# Patient Record
Sex: Male | Born: 1955 | Race: White | Hispanic: No | Smoking: Former smoker
Health system: Southern US, Community
[De-identification: ages and names within clinical notes are randomized; demographics above are authoritative.]

## PROBLEM LIST (undated history)

## (undated) DIAGNOSIS — E785 Hyperlipidemia, unspecified: Secondary | ICD-10-CM

## (undated) DIAGNOSIS — I10 Essential (primary) hypertension: Secondary | ICD-10-CM

## (undated) HISTORY — DX: Hyperlipidemia, unspecified: E78.5

---

## 2006-08-07 ENCOUNTER — Emergency Department: Payer: Self-pay

## 2006-10-02 ENCOUNTER — Emergency Department: Payer: Self-pay | Admitting: Emergency Medicine

## 2008-07-01 LAB — HM COLONOSCOPY: HM Colonoscopy: NORMAL

## 2008-09-05 ENCOUNTER — Emergency Department: Payer: Self-pay | Admitting: Emergency Medicine

## 2008-09-11 ENCOUNTER — Emergency Department: Payer: Self-pay

## 2009-02-09 ENCOUNTER — Ambulatory Visit: Payer: Self-pay | Admitting: Gastroenterology

## 2014-06-14 LAB — BASIC METABOLIC PANEL
BUN: 12 mg/dL (ref 4–21)
Creatinine: 0.9 mg/dL (ref <=1.3)
Glucose: 83 mg/dL

## 2014-06-14 LAB — FECAL OCCULT BLOOD, GUAIAC: Fecal Occult Blood: NEGATIVE

## 2014-06-14 LAB — LIPID PANEL
Cholesterol: 226 mg/dL — AB (ref 0–200)
HDL: 53 mg/dL (ref 35–70)
LDL Cholesterol: 156 mg/dL
Triglycerides: 85 mg/dL (ref 40–160)

## 2014-11-08 DIAGNOSIS — E7800 Pure hypercholesterolemia, unspecified: Secondary | ICD-10-CM | POA: Insufficient documentation

## 2014-11-08 DIAGNOSIS — Z1331 Encounter for screening for depression: Secondary | ICD-10-CM | POA: Insufficient documentation

## 2014-11-08 DIAGNOSIS — I10 Essential (primary) hypertension: Secondary | ICD-10-CM | POA: Insufficient documentation

## 2014-11-08 DIAGNOSIS — Z0000 Encounter for general adult medical examination without abnormal findings: Secondary | ICD-10-CM | POA: Insufficient documentation

## 2014-11-08 DIAGNOSIS — Z8739 Personal history of other diseases of the musculoskeletal system and connective tissue: Secondary | ICD-10-CM | POA: Insufficient documentation

## 2015-01-06 ENCOUNTER — Encounter: Payer: Self-pay | Admitting: Emergency Medicine

## 2015-01-06 ENCOUNTER — Emergency Department
Admission: EM | Admit: 2015-01-06 | Discharge: 2015-01-06 | Disposition: A | Discharge status: Home / Self Care | Payer: BLUE CROSS/BLUE SHIELD | Attending: Emergency Medicine | Admitting: Emergency Medicine

## 2015-01-06 ENCOUNTER — Emergency Department: Payer: BLUE CROSS/BLUE SHIELD

## 2015-01-06 DIAGNOSIS — I10 Essential (primary) hypertension: Secondary | ICD-10-CM | POA: Insufficient documentation

## 2015-01-06 DIAGNOSIS — Z7982 Long term (current) use of aspirin: Secondary | ICD-10-CM | POA: Insufficient documentation

## 2015-01-06 DIAGNOSIS — R109 Unspecified abdominal pain: Secondary | ICD-10-CM

## 2015-01-06 DIAGNOSIS — Z87891 Personal history of nicotine dependence: Secondary | ICD-10-CM | POA: Insufficient documentation

## 2015-01-06 HISTORY — DX: Essential (primary) hypertension: I10

## 2015-01-06 LAB — CBC WITH DIFFERENTIAL/PLATELET
Basophils Absolute: 0.1 10*3/uL (ref 0–0.1)
Basophils Relative: 1 %
Eosinophils Absolute: 0 10*3/uL (ref 0–0.7)
Eosinophils Relative: 1 %
HCT: 43.7 % (ref 40.0–52.0)
Hemoglobin: 14.9 g/dL (ref 13.0–18.0)
Lymphocytes Relative: 21 %
Lymphs Abs: 1.4 10*3/uL (ref 1.0–3.6)
MCH: 31 pg (ref 26.0–34.0)
MCHC: 34.2 g/dL (ref 32.0–36.0)
MCV: 90.8 fL (ref 80.0–100.0)
Monocytes Absolute: 0.7 10*3/uL (ref 0.2–1.0)
Monocytes Relative: 10 %
Neutro Abs: 4.4 10*3/uL (ref 1.4–6.5)
Neutrophils Relative %: 67 %
Platelets: 255 10*3/uL (ref 150–440)
RBC: 4.81 MIL/uL (ref 4.40–5.90)
RDW: 12.8 % (ref 11.5–14.5)
WBC: 6.6 10*3/uL (ref 3.8–10.6)

## 2015-01-06 LAB — COMPREHENSIVE METABOLIC PANEL
ALT: 44 U/L (ref 17–63)
AST: 46 U/L — ABNORMAL HIGH (ref 15–41)
Albumin: 4.5 g/dL (ref 3.5–5.0)
Alkaline Phosphatase: 62 U/L (ref 38–126)
Anion gap: 12 (ref 5–15)
BUN: 18 mg/dL (ref 6–20)
CO2: 24 mmol/L (ref 22–32)
Calcium: 8.9 mg/dL (ref 8.9–10.3)
Chloride: 97 mmol/L — ABNORMAL LOW (ref 101–111)
Creatinine, Ser: 1.06 mg/dL (ref 0.61–1.24)
GFR calc Af Amer: >60 mL/min (ref >60)
GFR calc non Af Amer: >60 mL/min (ref >60)
Glucose, Bld: 125 mg/dL — ABNORMAL HIGH (ref 65–99)
Potassium: 3.1 mmol/L — ABNORMAL LOW (ref 3.5–5.1)
Sodium: 133 mmol/L — ABNORMAL LOW (ref 135–145)
Total Bilirubin: 0.9 mg/dL (ref 0.3–1.2)
Total Protein: 8 g/dL (ref 6.5–8.1)

## 2015-01-06 NOTE — Discharge Instructions (Signed)
The x rays are normal as are the lab tests.   If you develop a f Abdominal Pain Many things can cause abdominal pain. Usually, abdominal pain is not caused by a disease and will improve without treatment. It can often be observed and treated at home. Your health care provider will do a physical exam and possibly order blood tests and X-rays to help determine the seriousness of your pain. However, in many cases, more time must pass before a clear cause of the pain can be found. Before that point, your health care provider may not know if you need more testing or further treatment. HOME CARE INSTRUCTIONS  Monitor your abdominal pain for any changes. The following actions may help to alleviate any discomfort you are experiencing:  Only take over-the-counter or prescription medicines as directed by your health care provider.  Do not take laxatives unless directed to do so by your health care provider.  Try a clear liquid diet (broth, tea, or water) as directed by your health care provider. Slowly move to a bland diet as tolerated. SEEK MEDICAL CARE IF:  You have unexplained abdominal pain.  You have abdominal pain associated with nausea or diarrhea.  You have pain when you urinate or have a bowel movement.  You experience abdominal pain that wakes you in the night.  You have abdominal pain that is worsened or improved by eating food.  You have abdominal pain that is worsened with eating fatty foods.  You have a fever. SEEK IMMEDIATE MEDICAL CARE IF:   Your pain does not go away within 2 hours.  You keep throwing up (vomiting).  Your pain is felt only in portions of the abdomen, such as the right side or the left lower portion of the abdomen.  You pass bloody or black tarry stools. MAKE SURE YOU:  Understand these instructions.   Will watch your condition.   Will get help right away if you are not doing well or get worse.  Document Released: 03/27/2005 Document Revised:  06/22/2013 Document Reviewed: 02/24/2013 [employer] Patient Information 2015 [place], [place]. This information is not intended to replace advice given to you by your health care provider. Make sure you discuss any questions you have with your health care provider. ever, vomiting or severe belly pain please return.   Please follow up with your doctor in a bout a week otherwise.

## 2015-01-06 NOTE — ED Notes (Signed)
AAOx3.  Skin warm and dry.  D/c home 

## 2015-01-06 NOTE — ED Notes (Signed)
Pt states he had abd pain beginning Monday with diarrhea, took immodium Tue night, and has been constipated until this am, is concerned "he has some blockages." Denies pain, appears in no distress.

## 2015-01-06 NOTE — ED Provider Notes (Signed)
[facility] Emergency Department Provider Note  ____________________________________________  Time seen: Approximately 4:49 PM  I have reviewed the triage vital signs and the nursing notes.   HISTORY  Chief Complaint Abdominal Pain    HPI Kenneth Velasquez is a 59 y.o. male who reports having diarrhea several days ago he took Imodium and then he didn't have any stool on Wednesday or Thursday and then this morning he had at that the stool and felt better but he is worried he is obstructed he says she's had some blockages in the past although he's never had any surgery and he again is concerned he has a block in his bowel. He is not currently having any cramping E has again not had any surgery nothing seems to make this better or worse although he did have a little bit of reflux and 8 breakfast this morning and nothing else seems to be bothering him   Past Medical History  Diagnosis Date  . Hypertension     Patient Active Problem List   Diagnosis Date Noted  . Routine general medical examination at a health care facility 11/08/2014  . Benign essential HTN 11/08/2014  . Screening for depression 11/08/2014  . Hypercholesteremia 11/08/2014  . H/O arthritis 11/08/2014    History reviewed. No pertinent past surgical history.  Current Outpatient Rx  Name  Route  Sig  Dispense  Refill  . aspirin 81 MG tablet   Oral   Take 1 tablet by mouth daily.         . hydrochlorothiazide (MICROZIDE) 12.5 MG capsule   Oral   Take 1 capsule by mouth daily.           Allergies Review of patient's allergies indicates no known allergies.  No family history on file.  Social History History  Substance Use Topics  . Smoking status: Former Smoker    Types: Cigarettes  . Smokeless tobacco: Former Neurosurgeon    Quit date: 07/01/2008  . Alcohol Use: 0.0 oz/week    0 Standard drinks or equivalent per week     Comment: occas    Review of Systems Constitutional: No  fever/chills Eyes: No visual changes. ENT: No sore throat. Cardiovascular: Denies chest pain. Respiratory: Denies shortness of breath. Gastrointestinal: No abdominal pain.  No nausea, no vomiting.  No diarrhea.  No constipation. Genitourinary: Negative for dysuria. Musculoskeletal: Negative for back pain. Skin: Negative for rash. Neurological: Negative for headaches, focal weakness or numbness.  10-point ROS otherwise negative.  ____________________________________________   PHYSICAL EXAM:  VITAL SIGNS: ED Triage Vitals  Enc Vitals Group     BP 01/06/15 1353 167/82 mmHg     Pulse Rate 01/06/15 1353 91     Resp 01/06/15 1353 18     Temp 01/06/15 1353 97.9 F (36.6 C)     Temp Source 01/06/15 1353 Oral     SpO2 01/06/15 1353 100 %     Weight 01/06/15 1353 155 lb (70.308 kg)     Height 01/06/15 1353  (1.753 m)     Head Cir --      Peak Flow --      Pain Score 01/06/15 1631 0     Pain Loc --      Pain Edu? --      Excl. in GC? --     Constitutional: Alert and oriented. Well appearing and in no acute distress. Eyes: Conjunctivae are normal. PERRL. EOMI. Head: Atraumatic. Nose: No congestion/rhinnorhea. Mouth/Throat: Mucous membranes  are moist.  Oropharynx non-erythematous. Neck: No stridor.   Cardiovascular: Normal rate, regular rhythm. Grossly normal heart sounds.  Good peripheral circulation. Respiratory: Normal respiratory effort.  No retractions. Lungs CTAB. Gastrointestinal: Soft and nontender. No distention. No abdominal bruits. No CVA tenderness. Musculoskeletal: No lower extremity tenderness nor edema.  No joint effusions. Neurologic:  Normal speech and language. No gross focal neurologic deficits are appreciated. Speech is normal. No gait instability. Skin:  Skin is warm, dry and intact. No rash noted. Psychiatric: Mood and affect are normal. Speech and behavior are normal.  ____________________________________________   LABS (all labs ordered are  listed, but only abnormal results are displayed)  Labs Reviewed  COMPREHENSIVE METABOLIC PANEL - Abnormal; Notable for the following:    Sodium 133 (*)    Potassium 3.1 (*)    Chloride 97 (*)    Glucose, Bld 125 (*)    AST 46 (*)    All other components within normal limits  CBC WITH DIFFERENTIAL/PLATELET   ____________________________________________  EKG   ____________________________________________  RADIOLOGY  Acute abdominal series is read by radiology and is normal ____________________________________________   PROCEDURES    ____________________________________________   INITIAL IMPRESSION / ASSESSMENT AND PLAN / ED COURSE  Pertinent labs & imaging results that were available during my care of the patient were reviewed by me and considered in my medical decision making (see chart for details).  These are normal patient's abdominal exam is benign discharge ____________________________________________   FINAL CLINICAL IMPRESSION(S) / ED DIAGNOSES  Final diagnoses:  Abdominal pain, unspecified abdominal location      Arnaldo NatalPaul F Dorlis Judice, MD 01/06/15 1731

## 2015-03-03 ENCOUNTER — Ambulatory Visit (INDEPENDENT_AMBULATORY_CARE_PROVIDER_SITE_OTHER): Payer: BLUE CROSS/BLUE SHIELD | Admitting: Family Medicine

## 2015-03-03 ENCOUNTER — Encounter: Payer: Self-pay | Admitting: Family Medicine

## 2015-03-03 VITALS — BP 110/62 | HR 60 | Ht 69.0 in | Wt 160.2 lb

## 2015-03-03 DIAGNOSIS — I10 Essential (primary) hypertension: Secondary | ICD-10-CM | POA: Diagnosis not present

## 2015-03-03 MED ORDER — HYDROCHLOROTHIAZIDE 12.5 MG PO CAPS: 12.5000 mg | ORAL_CAPSULE | Freq: Every day | ORAL | Status: DC

## 2015-03-03 NOTE — Progress Notes (Signed)
Name: Kenneth Velasquez   MRN: [id number]    DOB: 1955-10-19   Date:03/03/2015       Progress Note  Subjective  Chief Complaint  Chief Complaint  Patient presents with  . Hypertension    Hypertension This is a new problem. The current episode started more than 1 month ago. The problem has been gradually improving since onset. The problem is controlled. Pertinent negatives include no anxiety, blurred vision, chest pain, headaches, malaise/fatigue, neck pain, orthopnea, palpitations, peripheral edema, PND, shortness of breath or sweats. There are no associated agents to hypertension. There are no known risk factors for coronary artery disease. Past treatments include diuretics. The current treatment provides mild improvement. There are no compliance problems.  There is no history of angina, kidney disease, CAD/MI, CVA, heart failure, left ventricular hypertrophy, PVD, renovascular disease or retinopathy. There is no history of chronic renal disease.    No problem-specific assessment & plan notes found for this encounter.   Past Medical History  Diagnosis Date  . Hypertension     No past surgical history on file.  No family history on file.  Social History   Social History  . Marital Status: Married    Spouse Name: N/A  . Number of Children: N/A  . Years of Education: N/A   Occupational History  . Not on file.   Social History Main Topics  . Smoking status: Former Smoker    Types: Cigarettes  . Smokeless tobacco: Former Neurosurgeon    Quit date: 07/01/2008  . Alcohol Use: 0.0 oz/week    0 Standard drinks or equivalent per week     Comment: occas  . Drug Use: No  . Sexual Activity: Not on file   Other Topics Concern  . Not on file   Social History Narrative    No Known Allergies   Review of Systems  Constitutional: Negative for fever, chills, weight loss and malaise/fatigue.  HENT: Negative for ear discharge, ear pain and sore throat.   Eyes: Negative for blurred  vision.  Respiratory: Negative for cough, sputum production, shortness of breath and wheezing.   Cardiovascular: Negative for chest pain, palpitations, orthopnea, leg swelling and PND.  Gastrointestinal: Negative for heartburn, nausea, abdominal pain, diarrhea, constipation, blood in stool and melena.  Genitourinary: Negative for dysuria, urgency, frequency and hematuria.  Musculoskeletal: Negative for myalgias, back pain, joint pain and neck pain.  Skin: Negative for rash.  Neurological: Negative for dizziness, tingling, sensory change, focal weakness and headaches.  Endo/Heme/Allergies: Negative for environmental allergies and polydipsia. Does not bruise/bleed easily.  Psychiatric/Behavioral: Negative for depression and suicidal ideas. The patient is not nervous/anxious and does not have insomnia.      Objective  Filed Vitals:   03/03/15 0802  BP: 110/62  Pulse: 60  Height: 5\' 9"  (1.753 m)  Weight: 160 lb 3.2 oz (72.666 kg)    Physical Exam  Constitutional: He is oriented to person, place, and time and well-developed, well-nourished, and in no distress. No distress.  HENT:  Head: Normocephalic.  Right Ear: External ear normal.  Left Ear: External ear normal.  Nose: Nose normal.  Mouth/Throat: Oropharynx is clear and moist.  Eyes: Conjunctivae and EOM are normal. Pupils are equal, round, and reactive to light. Right eye exhibits no discharge. Left eye exhibits no discharge. No scleral icterus.  Neck: Normal range of motion. Neck supple. No JVD present. No tracheal deviation present. No thyromegaly present.  Cardiovascular: Normal rate, regular rhythm, normal heart sounds and intact  distal pulses.  Exam reveals no gallop and no friction rub.   No murmur heard. Pulmonary/Chest: Breath sounds normal. No respiratory distress. He has no wheezes. He has no rales.  Abdominal: Soft. Bowel sounds are normal. He exhibits no mass. There is no hepatosplenomegaly. There is no tenderness.  There is no rebound, no guarding and no CVA tenderness.  Musculoskeletal: Normal range of motion. He exhibits no edema or tenderness.  Lymphadenopathy:    He has no cervical adenopathy.  Neurological: He is alert and oriented to person, place, and time. He has normal sensation, normal strength, normal reflexes and intact cranial nerves. No cranial nerve deficit.  Skin: Skin is warm. No rash noted. He is not diaphoretic.  Psychiatric: Mood and affect normal.      Assessment & Plan  Problem List Items Addressed This Visit      Cardiovascular and Mediastinum   Benign essential HTN - Primary   Relevant Medications   hydrochlorothiazide (MICROZIDE) 12.5 MG capsule        Dr. Hayden [facility]  03/03/2015

## 2015-07-30 IMAGING — CR DG ABDOMEN ACUTE W/ 1V CHEST
3 series · 3 of 3 positions shown · non-contrast
Comparison: One-view abdomen 09/05/2008. Two-view chest x-ray
08/07/2006

CLINICAL DATA: Abdominal pain beginning [REDACTED] would diarrhea.
Constipation today.

EXAM:
DG ABDOMEN ACUTE W/ 1V CHEST

[chest pa]
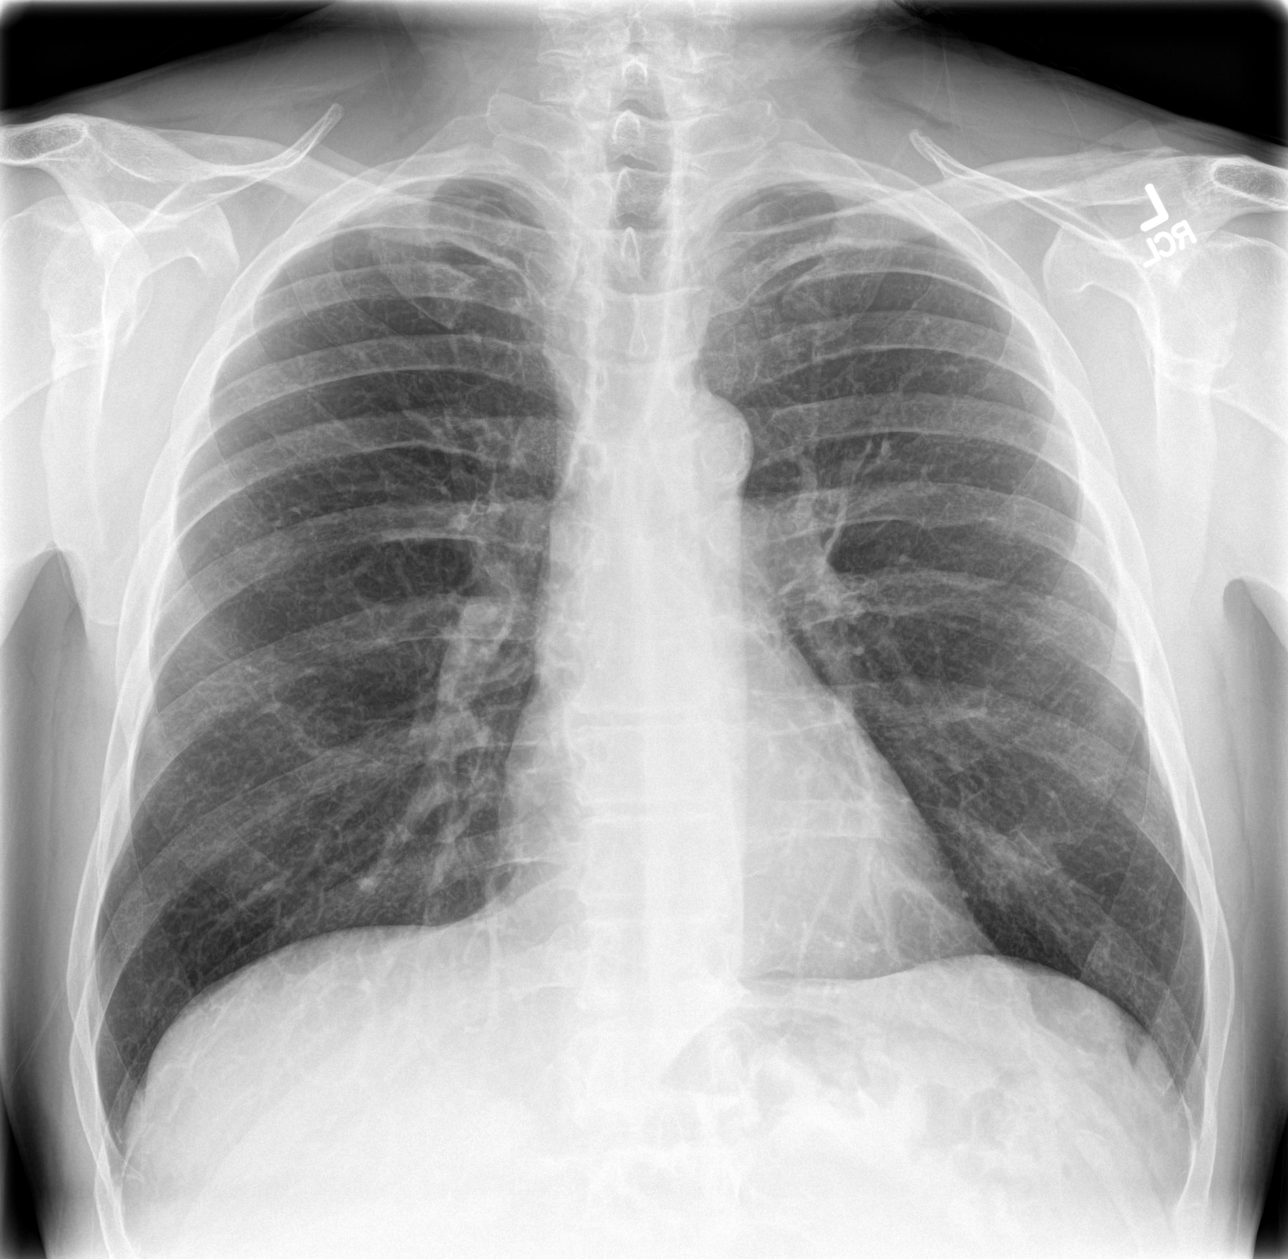

[abdomen erect]
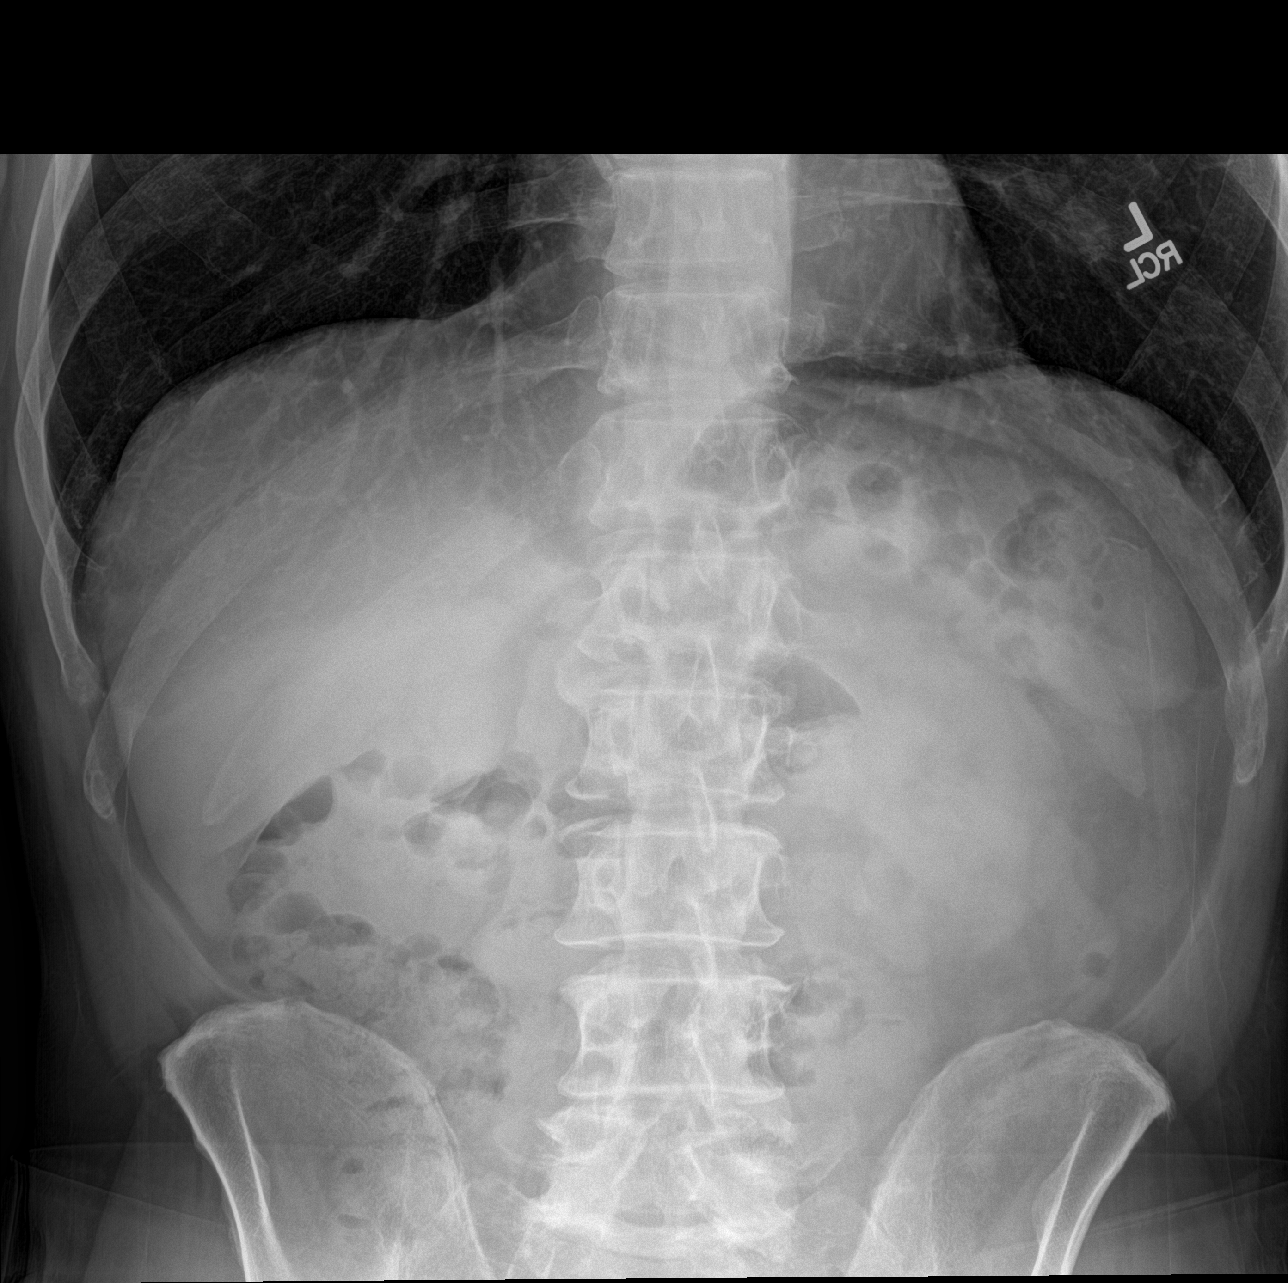

[abdomen supine]
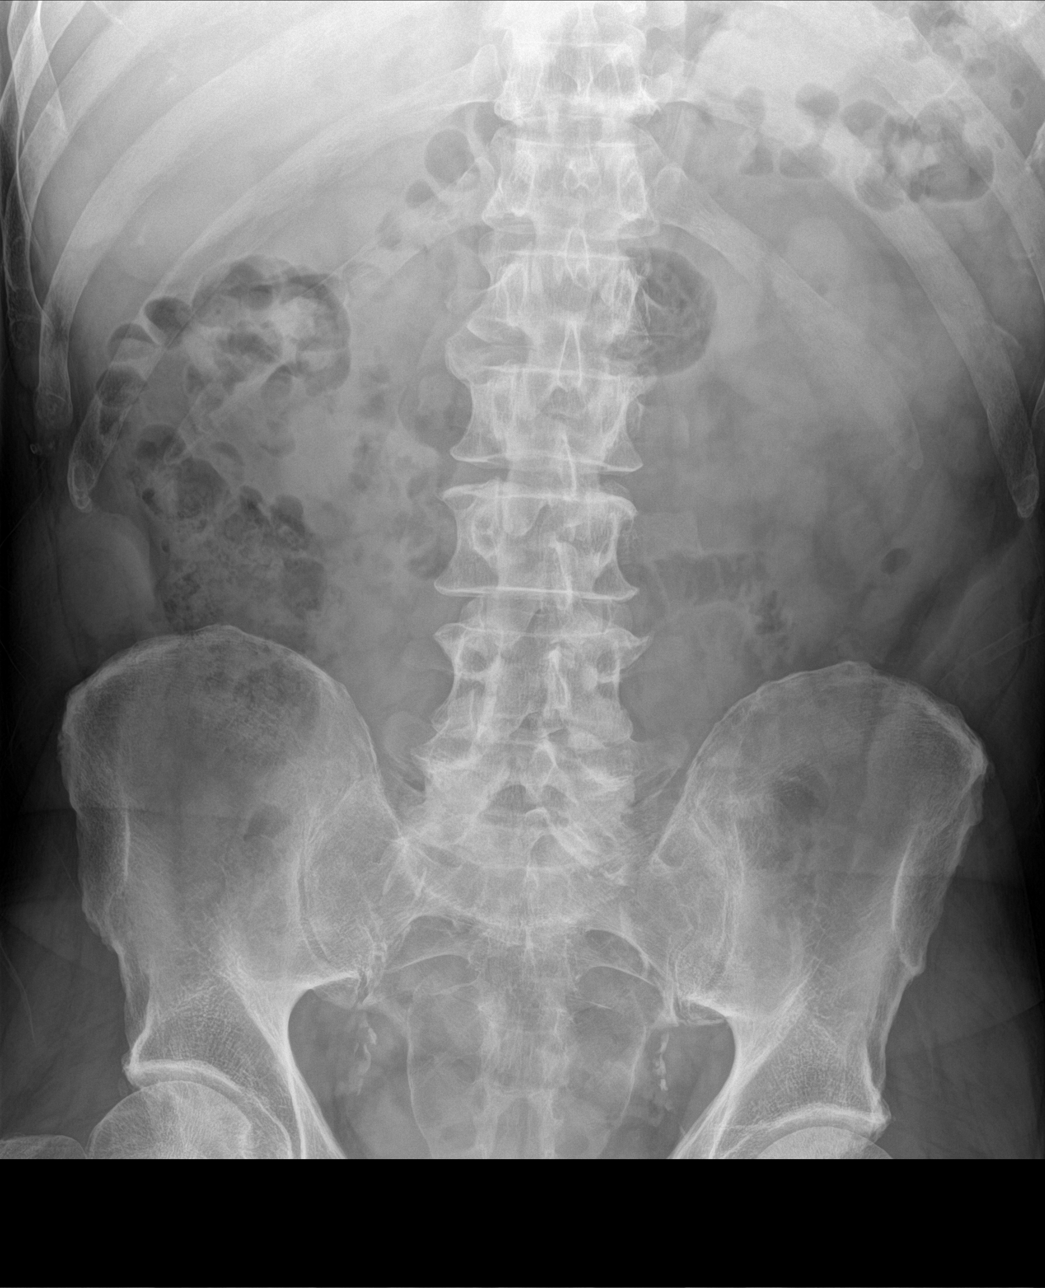

[3 of 3 positions shown; findings below may reference images not displayed]

FINDINGS: The heart size is normal. Atherosclerotic calcifications are noted
at the aortic arch. The lungs are clear.

Supine and upright views the abdomen demonstrate a normal bowel gas
pattern. There is no evidence for obstruction or free air.
Atherosclerotic calcifications are present in the aorta and iliac
vessels. The axial skeleton is within normal limits.
IMPRESSION: 1. No acute abnormality of the chest or abdomen.
2. Atherosclerosis.

## 2015-09-01 ENCOUNTER — Encounter: Payer: Self-pay | Admitting: Family Medicine

## 2015-09-01 ENCOUNTER — Ambulatory Visit (INDEPENDENT_AMBULATORY_CARE_PROVIDER_SITE_OTHER): Payer: BLUE CROSS/BLUE SHIELD | Admitting: Family Medicine

## 2015-09-01 VITALS — BP 118/62 | HR 68 | Ht 69.0 in | Wt 167.0 lb

## 2015-09-01 DIAGNOSIS — I10 Essential (primary) hypertension: Secondary | ICD-10-CM | POA: Diagnosis not present

## 2015-09-01 MED ORDER — HYDROCHLOROTHIAZIDE 12.5 MG PO CAPS: 12.5000 mg | ORAL_CAPSULE | Freq: Every day | ORAL | Status: DC

## 2015-09-01 NOTE — Progress Notes (Signed)
Name: Kenneth Velasquez   MRN: [id number]    DOB: 21-Sep-1955   Date:09/01/2015       Progress Note  Subjective  Chief Complaint  Chief Complaint  Patient presents with  . Hypertension    Hypertension This is a chronic problem. The current episode started more than 1 year ago. The problem has been gradually improving since onset. The problem is controlled. Pertinent negatives include no anxiety, blurred vision, chest pain, headaches, malaise/fatigue, neck pain, palpitations or shortness of breath. There are no associated agents to hypertension. Past treatments include diuretics. The current treatment provides moderate improvement. There are no compliance problems.  There is no history of angina, kidney disease, CAD/MI, CVA, heart failure, left ventricular hypertrophy, PVD, renovascular disease or retinopathy. There is no history of chronic renal disease or a hypertension causing med.    No problem-specific assessment & plan notes found for this encounter.   Past Medical History  Diagnosis Date  . Hypertension     History reviewed. No pertinent past surgical history.  History reviewed. No pertinent family history.  Social History   Social History  . Marital Status: Married    Spouse Name: N/A  . Number of Children: N/A  . Years of Education: N/A   Occupational History  . Not on file.   Social History Main Topics  . Smoking status: Former Smoker    Types: Cigarettes  . Smokeless tobacco: Former NeurosurgeonUser    Quit date: 07/01/2008  . Alcohol Use: 0.0 oz/week    0 Standard drinks or equivalent per week     Comment: occas  . Drug Use: No  . Sexual Activity: Not on file   Other Topics Concern  . Not on file   Social History Narrative    No Known Allergies   Review of Systems  Constitutional: Negative for fever, chills, weight loss and malaise/fatigue.  HENT: Negative for ear discharge, ear pain and sore throat.   Eyes: Negative for blurred vision.  Respiratory: Negative  for cough, sputum production, shortness of breath and wheezing.   Cardiovascular: Negative for chest pain, palpitations and leg swelling.  Gastrointestinal: Negative for heartburn, nausea, abdominal pain, diarrhea, constipation, blood in stool and melena.  Genitourinary: Negative for dysuria, urgency, frequency and hematuria.  Musculoskeletal: Negative for myalgias, back pain, joint pain and neck pain.  Skin: Negative for rash.  Neurological: Negative for dizziness, tingling, sensory change, focal weakness and headaches.  Endo/Heme/Allergies: Negative for environmental allergies and polydipsia. Does not bruise/bleed easily.  Psychiatric/Behavioral: Negative for depression and suicidal ideas. The patient is not nervous/anxious and does not have insomnia.      Objective  Filed Vitals:   09/01/15 0757  BP: 118/62  Pulse: 68  Height: 5\' 9"  (1.753 m)  Weight: 167 lb (75.751 kg)    Physical Exam  Constitutional: He is oriented to person, place, and time and well-developed, well-nourished, and in no distress.  HENT:  Head: Normocephalic.  Right Ear: External ear normal.  Left Ear: External ear normal.  Nose: Nose normal.  Mouth/Throat: Oropharynx is clear and moist.  Eyes: Conjunctivae and EOM are normal. Pupils are equal, round, and reactive to light. Right eye exhibits no discharge. Left eye exhibits no discharge. No scleral icterus.  Neck: Normal range of motion. Neck supple. No JVD present. No tracheal deviation present. No thyromegaly present.  Cardiovascular: Normal rate, regular rhythm, normal heart sounds and intact distal pulses.  Exam reveals no gallop and no friction rub.   No  murmur heard. Pulmonary/Chest: Breath sounds normal. No respiratory distress. He has no wheezes. He has no rales.  Abdominal: Soft. Bowel sounds are normal. He exhibits no mass. There is no hepatosplenomegaly. There is no tenderness. There is no rebound, no guarding and no CVA tenderness.   Musculoskeletal: Normal range of motion. He exhibits no edema or tenderness.  Lymphadenopathy:    He has no cervical adenopathy.  Neurological: He is alert and oriented to person, place, and time. He has normal sensation, normal strength and intact cranial nerves. No cranial nerve deficit.  Skin: Skin is warm. No rash noted.  Psychiatric: Mood and affect normal.  Nursing note and vitals reviewed.     Assessment & Plan  Problem List Items Addressed This Visit      Cardiovascular and Mediastinum   Benign essential HTN - Primary   Relevant Medications   hydrochlorothiazide (MICROZIDE) 12.5 MG capsule   Other Relevant Orders   Renal Function Panel        Dr. Elizabeth Sauer [facility]  09/01/2015

## 2015-09-02 LAB — RENAL FUNCTION PANEL
Albumin: 4.5 g/dL (ref 3.6–4.8)
BUN/Creatinine Ratio: 13 (ref 10–22)
BUN: 11 mg/dL (ref 8–27)
CO2: 23 mmol/L (ref 18–29)
Calcium: 9.3 mg/dL (ref 8.6–10.2)
Chloride: 98 mmol/L (ref 96–106)
Creatinine, Ser: 0.85 mg/dL (ref 0.76–1.27)
GFR calc Af Amer: 109 mL/min/{1.73_m2} (ref >59)
GFR calc non Af Amer: 95 mL/min/{1.73_m2} (ref >59)
Glucose: 78 mg/dL (ref 65–99)
Phosphorus: 3.2 mg/dL (ref 2.5–4.5)
Potassium: 4.2 mmol/L (ref 3.5–5.2)
Sodium: 141 mmol/L (ref 134–144)

## 2016-03-08 ENCOUNTER — Ambulatory Visit: Payer: BLUE CROSS/BLUE SHIELD | Admitting: Family Medicine

## 2016-03-13 ENCOUNTER — Ambulatory Visit (INDEPENDENT_AMBULATORY_CARE_PROVIDER_SITE_OTHER): Payer: BLUE CROSS/BLUE SHIELD | Admitting: Family Medicine

## 2016-03-13 ENCOUNTER — Encounter: Payer: Self-pay | Admitting: Family Medicine

## 2016-03-13 VITALS — BP 130/88 | HR 64 | Ht 69.0 in | Wt 164.0 lb

## 2016-03-13 DIAGNOSIS — Z23 Encounter for immunization: Secondary | ICD-10-CM

## 2016-03-13 DIAGNOSIS — I10 Essential (primary) hypertension: Secondary | ICD-10-CM

## 2016-03-13 MED ORDER — HYDROCHLOROTHIAZIDE 12.5 MG PO CAPS: 12.5000 mg | ORAL_CAPSULE | Freq: Every day | ORAL | 1 refills | Status: DC

## 2016-03-13 NOTE — Progress Notes (Signed)
Name: Kenneth Velasquez   MRN: [id number]    DOB: 1956/04/06   Date:03/13/2016       Progress Note  Subjective  Chief Complaint  Chief Complaint  Patient presents with  . Hypertension    Hypertension  This is a chronic problem. The current episode started more than 1 year ago. The problem has been gradually improving since onset. The problem is controlled. Pertinent negatives include no anxiety, blurred vision, chest pain, headaches, malaise/fatigue, neck pain, orthopnea, palpitations, peripheral edema, PND, shortness of breath or sweats. There are no associated agents to hypertension. There are no known risk factors for coronary artery disease. Past treatments include diuretics. The current treatment provides no improvement. There are no compliance problems.  There is no history of angina, kidney disease, CAD/MI, CVA, heart failure, left ventricular hypertrophy, PVD or retinopathy. There is no history of chronic renal disease or a hypertension causing med.    No problem-specific Assessment & Plan notes found for this encounter.   Past Medical History:  Diagnosis Date  . Hypertension     History reviewed. No pertinent surgical history.  History reviewed. No pertinent family history.  Social History   Social History  . Marital status: Married    Spouse name: N/A  . Number of children: N/A  . Years of education: N/A   Occupational History  . Not on file.   Social History Main Topics  . Smoking status: Former Smoker    Types: Cigarettes  . Smokeless tobacco: Former Neurosurgeon    Quit date: 07/01/2008  . Alcohol use 0.0 oz/week     Comment: occas  . Drug use: No  . Sexual activity: Not on file   Other Topics Concern  . Not on file   Social History Narrative  . No narrative on file    No Known Allergies   Review of Systems  Constitutional: Negative for chills, fever, malaise/fatigue and weight loss.  HENT: Negative for ear discharge, ear pain and sore throat.   Eyes:  Negative for blurred vision.  Respiratory: Negative for cough, sputum production, shortness of breath and wheezing.   Cardiovascular: Negative for chest pain, palpitations, orthopnea, leg swelling and PND.  Gastrointestinal: Positive for heartburn. Negative for abdominal pain, blood in stool, constipation, diarrhea, melena and nausea.  Genitourinary: Negative for dysuria, frequency, hematuria and urgency.  Musculoskeletal: Negative for back pain, joint pain, myalgias and neck pain.  Skin: Negative for rash.  Neurological: Negative for dizziness, tingling, sensory change, focal weakness and headaches.  Endo/Heme/Allergies: Negative for environmental allergies and polydipsia. Does not bruise/bleed easily.  Psychiatric/Behavioral: Negative for depression and suicidal ideas. The patient is not nervous/anxious and does not have insomnia.      Objective  Vitals:   03/13/16 0800  BP: 130/88  Pulse: 64  Weight: 164 lb (74.4 kg)  Height: 5\' 9"  (1.753 m)    Physical Exam  Constitutional: He is oriented to person, place, and time and well-developed, well-nourished, and in no distress.  HENT:  Head: Normocephalic.  Right Ear: Tympanic membrane, external ear and ear canal normal.  Left Ear: Tympanic membrane, external ear and ear canal normal.  Nose: Nose normal.  Mouth/Throat: Oropharynx is clear and moist.  Eyes: Conjunctivae and EOM are normal. Pupils are equal, round, and reactive to light. Right eye exhibits no discharge. Left eye exhibits no discharge. No scleral icterus.  Neck: Normal range of motion. Neck supple. No JVD present. No tracheal deviation present. No thyromegaly present.  Cardiovascular: Normal  rate, regular rhythm, normal heart sounds and intact distal pulses.  Exam reveals no gallop and no friction rub.   No murmur heard. Pulmonary/Chest: Breath sounds normal. No respiratory distress. He has no wheezes. He has no rales.  Abdominal: Soft. Bowel sounds are normal. He  exhibits no mass. There is no hepatosplenomegaly. There is no tenderness. There is no rebound, no guarding and no CVA tenderness.  Musculoskeletal: Normal range of motion. He exhibits no edema or tenderness.  Lymphadenopathy:    He has no cervical adenopathy.  Neurological: He is alert and oriented to person, place, and time. He has normal sensation, normal strength, normal reflexes and intact cranial nerves. No cranial nerve deficit.  Skin: Skin is warm. No rash noted.  Psychiatric: Mood and affect normal.  Nursing note and vitals reviewed.     Assessment & Plan  Problem List Items Addressed This Visit      Cardiovascular and Mediastinum   Essential hypertension - Primary   Relevant Medications   hydrochlorothiazide (MICROZIDE) 12.5 MG capsule   Other Relevant Orders   Renal Function Panel    Other Visit Diagnoses    Benign essential HTN       Relevant Medications   hydrochlorothiazide (MICROZIDE) 12.5 MG capsule   Need for influenza vaccination       Relevant Orders   Flu Vaccine QUAD 36+ mos PF IM (Fluarix & Fluzone Quad PF) (Completed)        Dr. Hayden Rasmusseneanna Ashante Snelling [facility]  03/13/16

## 2016-03-14 LAB — RENAL FUNCTION PANEL
Albumin: 4.3 g/dL (ref 3.6–4.8)
BUN/Creatinine Ratio: 13 (ref 10–24)
BUN: 13 mg/dL (ref 8–27)
CO2: 23 mmol/L (ref 18–29)
Calcium: 9.5 mg/dL (ref 8.6–10.2)
Chloride: 96 mmol/L (ref 96–106)
Creatinine, Ser: 1 mg/dL (ref 0.76–1.27)
GFR calc Af Amer: 94 mL/min/{1.73_m2} (ref >59)
GFR calc non Af Amer: 81 mL/min/{1.73_m2} (ref >59)
Glucose: 90 mg/dL (ref 65–99)
Phosphorus: 2.9 mg/dL (ref 2.5–4.5)
Potassium: 4 mmol/L (ref 3.5–5.2)
Sodium: 139 mmol/L (ref 134–144)

## 2016-09-11 ENCOUNTER — Encounter: Payer: Self-pay | Admitting: Family Medicine

## 2016-09-11 ENCOUNTER — Ambulatory Visit (INDEPENDENT_AMBULATORY_CARE_PROVIDER_SITE_OTHER): Payer: BLUE CROSS/BLUE SHIELD | Admitting: Family Medicine

## 2016-09-11 DIAGNOSIS — I10 Essential (primary) hypertension: Secondary | ICD-10-CM

## 2016-09-11 MED ORDER — HYDROCHLOROTHIAZIDE 12.5 MG PO CAPS: 12.5000 mg | ORAL_CAPSULE | Freq: Every day | ORAL | 8 refills | Status: DC

## 2016-09-11 NOTE — Progress Notes (Signed)
Name: Kenneth Velasquez   MRN: [id number]    DOB: 02/11/1956   Date:09/11/2016       Progress Note  Subjective  Chief Complaint  Chief Complaint  Patient presents with  . Hypertension    med refill    Hypertension  This is a chronic problem. The current episode started more than 1 year ago. The problem is unchanged. The problem is controlled. Pertinent negatives include no anxiety, blurred vision, chest pain, headaches, malaise/fatigue, neck pain, orthopnea, palpitations, peripheral edema, PND, shortness of breath or sweats. There are no associated agents to hypertension. There are no known risk factors for coronary artery disease. Past treatments include diuretics. The current treatment provides moderate improvement. There is no history of angina, kidney disease, CAD/MI, CVA, heart failure, left ventricular hypertrophy, PVD, renovascular disease or retinopathy. There is no history of chronic renal disease or a hypertension causing med.    No problem-specific Assessment & Plan notes found for this encounter.   Past Medical History:  Diagnosis Date  . Hypertension     No past surgical history on file.  No family history on file.  Social History   Social History  . Marital status: Married    Spouse name: N/A  . Number of children: N/A  . Years of education: N/A   Occupational History  . Not on file.   Social History Main Topics  . Smoking status: Former Smoker    Types: Cigarettes  . Smokeless tobacco: Former NeurosurgeonUser    Quit date: 07/01/2008  . Alcohol use 0.0 oz/week     Comment: occas  . Drug use: No  . Sexual activity: Not on file   Other Topics Concern  . Not on file   Social History Narrative  . No narrative on file    No Known Allergies  Outpatient Medications Prior to Visit  Medication Sig Dispense Refill  . aspirin 81 MG tablet Take 1 tablet by mouth daily.    . hydrochlorothiazide (MICROZIDE) 12.5 MG capsule Take 1 capsule (12.5 mg total) by mouth daily.  90 capsule 1   No facility-administered medications prior to visit.     Review of Systems  Constitutional: Negative for chills, fever, malaise/fatigue and weight loss.  HENT: Negative for ear discharge, ear pain and sore throat.   Eyes: Negative for blurred vision.  Respiratory: Negative for cough, sputum production, shortness of breath and wheezing.   Cardiovascular: Negative for chest pain, palpitations, orthopnea, leg swelling and PND.  Gastrointestinal: Negative for abdominal pain, blood in stool, constipation, diarrhea, heartburn, melena and nausea.  Genitourinary: Positive for frequency. Negative for dysuria, hematuria and urgency.  Musculoskeletal: Negative for back pain, joint pain, myalgias and neck pain.  Skin: Negative for rash.  Neurological: Negative for dizziness, tingling, sensory change, focal weakness and headaches.  Endo/Heme/Allergies: Negative for environmental allergies and polydipsia. Does not bruise/bleed easily.  Psychiatric/Behavioral: Negative for depression and suicidal ideas. The patient is not nervous/anxious and does not have insomnia.      Objective  Vitals:   09/11/16 0801  BP: 130/80  Pulse: 80  Weight: 175 lb (79.4 kg)  Height: 5\' 9"  (1.753 m)    Physical Exam  Constitutional: He is oriented to person, place, and time and well-developed, well-nourished, and in no distress.  HENT:  Head: Normocephalic.  Right Ear: External ear normal.  Left Ear: External ear normal.  Nose: Nose normal.  Mouth/Throat: Oropharynx is clear and moist.  Eyes: Conjunctivae and EOM are normal. Pupils are  equal, round, and reactive to light. Right eye exhibits no discharge. Left eye exhibits no discharge. No scleral icterus.  Neck: Normal range of motion. Neck supple. No JVD present. No tracheal deviation present. No thyromegaly present.  Cardiovascular: Normal rate, regular rhythm, normal heart sounds and intact distal pulses.  Exam reveals no gallop and no friction  rub.   No murmur heard. Pulmonary/Chest: Breath sounds normal. No respiratory distress. He has no wheezes. He has no rales.  Abdominal: Soft. Bowel sounds are normal. He exhibits no mass. There is no hepatosplenomegaly. There is no tenderness. There is no rebound, no guarding and no CVA tenderness.  Musculoskeletal: Normal range of motion. He exhibits no edema or tenderness.  Lymphadenopathy:    He has no cervical adenopathy.  Neurological: He is alert and oriented to person, place, and time. He has normal sensation, normal strength, normal reflexes and intact cranial nerves. No cranial nerve deficit.  Skin: Skin is warm. No rash noted.  Psychiatric: Mood and affect normal.  Nursing note and vitals reviewed.     Assessment & Plan  Problem List Items Addressed This Visit      Cardiovascular and Mediastinum   Benign essential HTN   Relevant Medications   hydrochlorothiazide (MICROZIDE) 12.5 MG capsule      Meds ordered this encounter  Medications  . hydrochlorothiazide (MICROZIDE) 12.5 MG capsule    Sig: Take 1 capsule (12.5 mg total) by mouth daily.    Dispense:  30 capsule    Refill:  8      Dr. Elizabeth Sauer [facility]  09/11/16

## 2017-06-20 ENCOUNTER — Other Ambulatory Visit: Payer: Self-pay | Admitting: Family Medicine

## 2017-06-20 DIAGNOSIS — I10 Essential (primary) hypertension: Secondary | ICD-10-CM

## 2017-07-19 ENCOUNTER — Other Ambulatory Visit: Payer: Self-pay | Admitting: Family Medicine

## 2017-07-19 DIAGNOSIS — I10 Essential (primary) hypertension: Secondary | ICD-10-CM

## 2017-07-24 ENCOUNTER — Encounter: Payer: Self-pay | Admitting: Family Medicine

## 2017-07-24 ENCOUNTER — Ambulatory Visit: Payer: BLUE CROSS/BLUE SHIELD | Admitting: Family Medicine

## 2017-07-24 VITALS — BP 120/70 | HR 80 | Ht 70.0 in | Wt 174.0 lb

## 2017-07-24 DIAGNOSIS — E785 Hyperlipidemia, unspecified: Secondary | ICD-10-CM | POA: Diagnosis not present

## 2017-07-24 DIAGNOSIS — Z1159 Encounter for screening for other viral diseases: Secondary | ICD-10-CM | POA: Diagnosis not present

## 2017-07-24 DIAGNOSIS — I10 Essential (primary) hypertension: Secondary | ICD-10-CM | POA: Diagnosis not present

## 2017-07-24 MED ORDER — HYDROCHLOROTHIAZIDE 12.5 MG PO CAPS: 12.5000 mg | ORAL_CAPSULE | Freq: Every day | ORAL | 3 refills | Status: DC

## 2017-07-24 NOTE — Progress Notes (Signed)
Name: Kenneth BlakeKenneth R Velasquez   MRN: [id number]    DOB: 08/20/1955   Date:07/24/2017       Progress Note  Subjective  Chief Complaint  Chief Complaint  Patient presents with  . Hypertension    needs lipid and renal    Hypertension  This is a chronic problem. The current episode started more than 1 year ago. The problem has been gradually improving since onset. The problem is controlled. Pertinent negatives include no anxiety, blurred vision, chest pain, headaches, malaise/fatigue, neck pain, orthopnea, palpitations, peripheral edema, PND, shortness of breath or sweats. There are no associated agents to hypertension. Risk factors for coronary artery disease include dyslipidemia and post-menopausal state. Past treatments include diuretics. The current treatment provides moderate improvement. There are no compliance problems.  There is no history of angina, kidney disease, CAD/MI, CVA, heart failure, left ventricular hypertrophy, PVD or retinopathy. There is no history of chronic renal disease, a hypertension causing med or renovascular disease.  Hyperlipidemia  This is a chronic problem. The current episode started more than 1 year ago. The problem is controlled. Recent lipid tests were reviewed and are normal. He has no history of chronic renal disease, diabetes, hypothyroidism, liver disease, obesity or nephrotic syndrome. Factors aggravating his hyperlipidemia include thiazides. Pertinent negatives include no chest pain, focal weakness, myalgias or shortness of breath. Current antihyperlipidemic treatment includes diet change and exercise. The current treatment provides mild improvement of lipids. There are no compliance problems.  Risk factors for coronary artery disease include dyslipidemia.    No problem-specific Assessment & Plan notes found for this encounter.   Past Medical History:  Diagnosis Date  . Hypertension     History reviewed. No pertinent surgical history.  History reviewed. No  pertinent family history.  Social History   Socioeconomic History  . Marital status: Married    Spouse name: Not on file  . Number of children: Not on file  . Years of education: Not on file  . Highest education level: Not on file  Social Needs  . Financial resource strain: Not on file  . Food insecurity - worry: Not on file  . Food insecurity - inability: Not on file  . Transportation needs - medical: Not on file  . Transportation needs - non-medical: Not on file  Occupational History  . Not on file  Tobacco Use  . Smoking status: Former Smoker    Types: Cigarettes  . Smokeless tobacco: Former NeurosurgeonUser    Quit date: 07/01/2008  Substance and Sexual Activity  . Alcohol use: Yes    Alcohol/week: 0.0 oz    Comment: occas  . Drug use: No  . Sexual activity: Not on file  Other Topics Concern  . Not on file  Social History Narrative  . Not on file    No Known Allergies  Outpatient Medications Prior to Visit  Medication Sig Dispense Refill  . aspirin 81 MG tablet Take 1 tablet by mouth daily.    . hydrochlorothiazide (MICROZIDE) 12.5 MG capsule TAKE ONE CAPSULE BY MOUTH DAILY 15 capsule 0   No facility-administered medications prior to visit.     Review of Systems  Constitutional: Negative for chills, fever, malaise/fatigue and weight loss.  HENT: Negative for ear discharge, ear pain and sore throat.   Eyes: Negative for blurred vision.  Respiratory: Negative for cough, sputum production, shortness of breath and wheezing.   Cardiovascular: Negative for chest pain, palpitations, orthopnea, leg swelling and PND.  Gastrointestinal: Negative for abdominal pain,  blood in stool, constipation, diarrhea, heartburn, melena and nausea.  Genitourinary: Negative for dysuria, frequency, hematuria and urgency.  Musculoskeletal: Negative for back pain, joint pain, myalgias and neck pain.  Skin: Negative for rash.  Neurological: Negative for dizziness, tingling, sensory change, focal  weakness and headaches.  Endo/Heme/Allergies: Negative for environmental allergies and polydipsia. Does not bruise/bleed easily.  Psychiatric/Behavioral: Negative for depression and suicidal ideas. The patient is not nervous/anxious and does not have insomnia.      Objective  Vitals:   07/24/17 0854  BP: 120/70  Pulse: 80  Weight: 174 lb (78.9 kg)  Height: 5\' 10"  (1.778 m)    Physical Exam  Constitutional: He is oriented to person, place, and time and well-developed, well-nourished, and in no distress.  Cardiovascular: Normal rate, regular rhythm, normal heart sounds and normal pulses.  Pulmonary/Chest: Effort normal and breath sounds normal.  Neurological: He is alert and oriented to person, place, and time.  Nursing note and vitals reviewed.     Assessment & Plan  Problem List Items Addressed This Visit      Cardiovascular and Mediastinum   Benign essential HTN - Primary   Relevant Medications   hydrochlorothiazide (MICROZIDE) 12.5 MG capsule   Other Relevant Orders   Renal Function Panel     Other   Dyslipidemia   Relevant Orders   Lipid Profile    Other Visit Diagnoses    Need for hepatitis C screening test       Relevant Orders   Hepatitis C antibody      Meds ordered this encounter  Medications  . hydrochlorothiazide (MICROZIDE) 12.5 MG capsule    Sig: Take 1 capsule (12.5 mg total) by mouth daily.    Dispense:  90 capsule    Refill:  3    Needs appt for this month      Dr. Elizabeth Sauer [facility]  07/24/17

## 2017-07-25 LAB — RENAL FUNCTION PANEL
Albumin: 4.6 g/dL (ref 3.6–4.8)
BUN/Creatinine Ratio: 14 (ref 10–24)
BUN: 13 mg/dL (ref 8–27)
CO2: 24 mmol/L (ref 20–29)
Calcium: 9.5 mg/dL (ref 8.6–10.2)
Chloride: 100 mmol/L (ref 96–106)
Creatinine, Ser: 0.95 mg/dL (ref 0.76–1.27)
GFR calc Af Amer: 99 mL/min/{1.73_m2} (ref >59)
GFR calc non Af Amer: 85 mL/min/{1.73_m2} (ref >59)
Glucose: 81 mg/dL (ref 65–99)
Phosphorus: 3.3 mg/dL (ref 2.5–4.5)
Potassium: 4.1 mmol/L (ref 3.5–5.2)
Sodium: 141 mmol/L (ref 134–144)

## 2017-07-25 LAB — LIPID PANEL
Chol/HDL Ratio: 5.6 ratio — ABNORMAL HIGH (ref 0.0–5.0)
Cholesterol, Total: 254 mg/dL — ABNORMAL HIGH (ref 100–199)
HDL: 45 mg/dL (ref >39)
LDL Calculated: 180 mg/dL — ABNORMAL HIGH (ref 0–99)
Triglycerides: 145 mg/dL (ref 0–149)
VLDL Cholesterol Cal: 29 mg/dL (ref 5–40)

## 2017-07-25 LAB — HEPATITIS C ANTIBODY: Hep C Virus Ab: <0.1 s/co ratio (ref 0.0–0.9)

## 2017-07-28 ENCOUNTER — Other Ambulatory Visit: Payer: Self-pay

## 2017-07-28 MED ORDER — ATORVASTATIN CALCIUM 20 MG PO TABS: 20.0000 mg | ORAL_TABLET | Freq: Every day | ORAL | 1 refills | Status: DC

## 2017-09-16 ENCOUNTER — Other Ambulatory Visit: Payer: BLUE CROSS/BLUE SHIELD

## 2017-09-16 DIAGNOSIS — R69 Illness, unspecified: Secondary | ICD-10-CM

## 2017-09-16 DIAGNOSIS — E782 Mixed hyperlipidemia: Secondary | ICD-10-CM

## 2017-09-17 ENCOUNTER — Other Ambulatory Visit: Payer: Self-pay

## 2017-09-17 LAB — LIPID PANEL WITH LDL/HDL RATIO
Cholesterol, Total: 183 mg/dL (ref 100–199)
HDL: 49 mg/dL (ref >39)
LDL Calculated: 106 mg/dL — ABNORMAL HIGH (ref 0–99)
LDl/HDL Ratio: 2.2 ratio (ref 0.0–3.6)
Triglycerides: 142 mg/dL (ref 0–149)
VLDL Cholesterol Cal: 28 mg/dL (ref 5–40)

## 2017-09-17 LAB — HEPATIC FUNCTION PANEL
ALT: 22 IU/L (ref 0–44)
AST: 23 IU/L (ref 0–40)
Albumin: 4.5 g/dL (ref 3.6–4.8)
Alkaline Phosphatase: 79 IU/L (ref 39–117)
Bilirubin Total: 0.5 mg/dL (ref 0.0–1.2)
Bilirubin, Direct: 0.12 mg/dL (ref 0.00–0.40)
Total Protein: 7.7 g/dL (ref 6.0–8.5)

## 2017-09-17 MED ORDER — ATORVASTATIN CALCIUM 20 MG PO TABS: 20.0000 mg | ORAL_TABLET | Freq: Every day | ORAL | 1 refills | Status: DC

## 2018-03-21 ENCOUNTER — Other Ambulatory Visit: Payer: Self-pay | Admitting: Family Medicine

## 2018-03-23 ENCOUNTER — Ambulatory Visit: Payer: BLUE CROSS/BLUE SHIELD | Admitting: Family Medicine

## 2018-03-24 ENCOUNTER — Ambulatory Visit: Payer: BLUE CROSS/BLUE SHIELD | Admitting: Family Medicine

## 2018-03-24 ENCOUNTER — Encounter: Payer: Self-pay | Admitting: Family Medicine

## 2018-03-24 VITALS — BP 130/82 | HR 64 | Ht 70.0 in | Wt 176.0 lb

## 2018-03-24 DIAGNOSIS — I10 Essential (primary) hypertension: Secondary | ICD-10-CM

## 2018-03-24 DIAGNOSIS — E785 Hyperlipidemia, unspecified: Secondary | ICD-10-CM

## 2018-03-24 MED ORDER — ATORVASTATIN CALCIUM 20 MG PO TABS: ORAL_TABLET | ORAL | 1 refills | Status: DC

## 2018-03-24 MED ORDER — HYDROCHLOROTHIAZIDE 12.5 MG PO CAPS: 12.5000 mg | ORAL_CAPSULE | Freq: Every day | ORAL | 1 refills | Status: DC

## 2018-03-24 NOTE — Progress Notes (Signed)
Date:  03/24/2018   Name:  Kenneth Velasquez   DOB:  May 17, 1956   MRN:  [id number]   Chief Complaint: Hypertension and Hyperlipidemia  Hypertension  This is a chronic problem. The current episode started more than 1 year ago. The problem is unchanged. The problem is controlled. Pertinent negatives include no anxiety, blurred vision, chest pain, headaches, malaise/fatigue, neck pain, orthopnea, palpitations, peripheral edema, PND, shortness of breath or sweats. There are no associated agents to hypertension. Risk factors for coronary artery disease include dyslipidemia. Past treatments include diuretics. The current treatment provides mild improvement. There are no compliance problems.  There is no history of angina, kidney disease, CAD/MI, heart failure, left ventricular hypertrophy or PVD. There is no history of chronic renal disease, a hypertension causing med or renovascular disease.  Hyperlipidemia  This is a chronic problem. The current episode started more than 1 year ago. The problem is controlled. Recent lipid tests were reviewed and are normal. He has no history of chronic renal disease, diabetes, hypothyroidism, liver disease, obesity or nephrotic syndrome. There are no known factors aggravating his hyperlipidemia. Pertinent negatives include no chest pain, focal sensory loss, focal weakness, leg pain, myalgias or shortness of breath. Current antihyperlipidemic treatment includes statins. The current treatment provides mild improvement of lipids. There are no compliance problems.  Risk factors for coronary artery disease include dyslipidemia and hypertension.    Review of Systems  Constitutional: Negative for chills, fever and malaise/fatigue.  HENT: Negative for drooling, ear discharge, ear pain and sore throat.   Eyes: Negative for blurred vision.  Respiratory: Negative for cough, shortness of breath and wheezing.   Cardiovascular: Negative for chest pain, palpitations, orthopnea, leg  swelling and PND.  Gastrointestinal: Negative for abdominal pain, blood in stool, constipation, diarrhea and nausea.  Endocrine: Negative for polydipsia.  Genitourinary: Negative for dysuria, frequency, hematuria and urgency.  Musculoskeletal: Negative for back pain, myalgias and neck pain.  Skin: Negative for rash.  Allergic/Immunologic: Negative for environmental allergies.  Neurological: Negative for dizziness, focal weakness and headaches.  Hematological: Does not bruise/bleed easily.  Psychiatric/Behavioral: Negative for suicidal ideas. The patient is not nervous/anxious.     Patient Active Problem List   Diagnosis Date Noted  . Dyslipidemia 07/24/2017  . Routine general medical examination at a health care facility 11/08/2014  . Benign essential HTN 11/08/2014  . Screening for depression 11/08/2014  . Hypercholesteremia 11/08/2014  . H/O arthritis 11/08/2014    No Known Allergies  History reviewed. No pertinent surgical history.  Social History   Tobacco Use  . Smoking status: Former Smoker    Types: Cigarettes  . Smokeless tobacco: Former Neurosurgeon    Quit date: 07/01/2008  Substance Use Topics  . Alcohol use: Yes    Alcohol/week: 0.0 standard drinks    Comment: occas  . Drug use: No     Medication list has been reviewed and updated.  Current Meds  Medication Sig  . aspirin 81 MG tablet Take 1 tablet by mouth daily.  Marland Kitchen atorvastatin (LIPITOR) 20 MG tablet TAKE 1 TABLET(20 MG) BY MOUTH DAILY  . hydrochlorothiazide (MICROZIDE) 12.5 MG capsule Take 1 capsule (12.5 mg total) by mouth daily.  . [DISCONTINUED] atorvastatin (LIPITOR) 20 MG tablet TAKE 1 TABLET(20 MG) BY MOUTH DAILY  . [DISCONTINUED] hydrochlorothiazide (MICROZIDE) 12.5 MG capsule Take 1 capsule (12.5 mg total) by mouth daily.    PHQ 2/9 Scores 07/24/2017 09/01/2015  PHQ - 2 Score 0 0  PHQ- 9 Score 2 -  Physical Exam  Constitutional: He is oriented to person, place, and time.  HENT:  Head:  Normocephalic.  Right Ear: External ear normal.  Left Ear: External ear normal.  Nose: Nose normal.  Mouth/Throat: Oropharynx is clear and moist.  Eyes: Pupils are equal, round, and reactive to light. Conjunctivae and EOM are normal. Right eye exhibits no discharge. Left eye exhibits no discharge. No scleral icterus.  Neck: Normal range of motion. Neck supple. No JVD present. No tracheal deviation present. No thyromegaly present.  Cardiovascular: Normal rate, regular rhythm, normal heart sounds and intact distal pulses. Exam reveals no gallop and no friction rub.  No murmur heard. Pulmonary/Chest: Breath sounds normal. No respiratory distress. He has no wheezes. He has no rales.  Abdominal: Soft. Bowel sounds are normal. He exhibits no mass. There is no hepatosplenomegaly. There is no tenderness. There is no rebound, no guarding and no CVA tenderness.  Musculoskeletal: Normal range of motion. He exhibits no edema or tenderness.  Lymphadenopathy:    He has no cervical adenopathy.  Neurological: He is alert and oriented to person, place, and time. He has normal strength and normal reflexes. No cranial nerve deficit.  Skin: Skin is warm. No rash noted.  Nursing note and vitals reviewed.   BP 130/82   Pulse 64   Ht 5\' 10"  (1.778 m)   Wt 176 lb (79.8 kg)   BMI 25.25 kg/m   Assessment and Plan:  1. Benign essential HTN Chronic Controlled Continue HCTZ 12.5 mg daily - hydrochlorothiazide (MICROZIDE) 12.5 MG capsule; Take 1 capsule (12.5 mg total) by mouth daily.  Dispense: 90 capsule; Refill: 1  2. Dyslipidemia Chronic Contolled. Continue atorvastatin 20 mg daily. - atorvastatin (LIPITOR) 20 MG tablet; TAKE 1 TABLET(20 MG) BY MOUTH DAILY  Dispense: 90 tablet; Refill: 1   Dr. Hayden Rasmusseneanna Tatjana [facility]cott [facility]  03/24/2018

## 2018-07-24 ENCOUNTER — Ambulatory Visit: Payer: BLUE CROSS/BLUE SHIELD | Admitting: Family Medicine

## 2018-08-09 ENCOUNTER — Other Ambulatory Visit: Payer: Self-pay | Admitting: Family Medicine

## 2018-08-09 DIAGNOSIS — I10 Essential (primary) hypertension: Secondary | ICD-10-CM

## 2018-09-25 ENCOUNTER — Ambulatory Visit: Payer: BLUE CROSS/BLUE SHIELD | Admitting: Family Medicine

## 2018-09-25 ENCOUNTER — Encounter: Payer: Self-pay | Admitting: Family Medicine

## 2018-09-25 ENCOUNTER — Other Ambulatory Visit: Payer: Self-pay

## 2018-09-25 VITALS — BP 130/80 | HR 72 | Ht 70.0 in | Wt 177.0 lb

## 2018-09-25 DIAGNOSIS — R69 Illness, unspecified: Secondary | ICD-10-CM

## 2018-09-25 DIAGNOSIS — E785 Hyperlipidemia, unspecified: Secondary | ICD-10-CM | POA: Diagnosis not present

## 2018-09-25 DIAGNOSIS — I10 Essential (primary) hypertension: Secondary | ICD-10-CM | POA: Diagnosis not present

## 2018-09-25 MED ORDER — HYDROCHLOROTHIAZIDE 12.5 MG PO CAPS: 12.5000 mg | ORAL_CAPSULE | Freq: Every day | ORAL | 1 refills | Status: DC

## 2018-09-25 MED ORDER — ATORVASTATIN CALCIUM 20 MG PO TABS: ORAL_TABLET | ORAL | 1 refills | Status: DC

## 2018-09-25 NOTE — Progress Notes (Signed)
Date:  09/25/2018   Name:  Kenneth Velasquez   DOB:  1955/11/29   MRN:  [id number]   Chief Complaint: Hypertension and Hyperlipidemia  Hypertension  This is a chronic problem. The current episode started more than 1 year ago. The problem has been gradually improving since onset. The problem is controlled. Pertinent negatives include no anxiety, blurred vision, chest pain, headaches, malaise/fatigue, neck pain, orthopnea, palpitations, peripheral edema, PND, shortness of breath or sweats. There are no associated agents to hypertension. There are no known risk factors for coronary artery disease. Past treatments include diuretics. The current treatment provides moderate improvement. There are no compliance problems.  There is no history of angina, kidney disease, CAD/MI, CVA, heart failure, left ventricular hypertrophy, PVD or retinopathy. There is no history of chronic renal disease, a hypertension causing med or renovascular disease.  Hyperlipidemia  This is a chronic problem. The current episode started more than 1 year ago. The problem is controlled. Recent lipid tests were reviewed and are normal. He has no history of chronic renal disease, diabetes, hypothyroidism, liver disease, obesity or nephrotic syndrome. There are no known factors aggravating his hyperlipidemia. Pertinent negatives include no chest pain, focal sensory loss, focal weakness, leg pain, myalgias or shortness of breath. Current antihyperlipidemic treatment includes statins. The current treatment provides moderate improvement of lipids. There are no compliance problems.  There are no known risk factors for coronary artery disease.    Review of Systems  Constitutional: Negative for malaise/fatigue.  Eyes: Negative for blurred vision.  Respiratory: Negative for shortness of breath.   Cardiovascular: Negative for chest pain, palpitations, orthopnea and PND.  Musculoskeletal: Negative for myalgias and neck pain.  Neurological:  Negative for focal weakness and headaches.    Patient Active Problem List   Diagnosis Date Noted  . Dyslipidemia 07/24/2017  . Routine general medical examination at a health care facility 11/08/2014  . Benign essential HTN 11/08/2014  . Screening for depression 11/08/2014  . Hypercholesteremia 11/08/2014  . H/O arthritis 11/08/2014    No Known Allergies  No past surgical history on file.  Social History   Tobacco Use  . Smoking status: Former Smoker    Types: Cigarettes  . Smokeless tobacco: Former Neurosurgeon    Quit date: 07/01/2008  Substance Use Topics  . Alcohol use: Yes    Alcohol/week: 0.0 standard drinks    Comment: occas  . Drug use: No     Medication list has been reviewed and updated.  Current Meds  Medication Sig  . aspirin 81 MG tablet Take 1 tablet by mouth daily.  Marland Kitchen atorvastatin (LIPITOR) 20 MG tablet TAKE 1 TABLET(20 MG) BY MOUTH DAILY  . hydrochlorothiazide (MICROZIDE) 12.5 MG capsule TAKE ONE CAPSULE BY MOUTH DAILY    PHQ 2/9 Scores 09/25/2018 07/24/2017 09/01/2015  PHQ - 2 Score 0 0 0  PHQ- 9 Score 1 2 -    BP Readings from Last 3 Encounters:  09/25/18 130/80  03/24/18 130/82  07/24/17 120/70    Physical Exam  Wt Readings from Last 3 Encounters:  09/25/18 177 lb (80.3 kg)  03/24/18 176 lb (79.8 kg)  07/24/17 174 lb (78.9 kg)    BP 130/80   Pulse 72   Ht 5\' 10"  (1.778 m)   Wt 177 lb (80.3 kg)   BMI 25.40 kg/m   Assessment and Plan: 1. Benign essential HTN Chronic.  Controlled.  Continue hydrochlorothiazide 12.5 mg 1 a day.  Pressure currently controlled on 130/80 today  we will check renal function panel today. - Renal Function Panel - hydrochlorothiazide (MICROZIDE) 12.5 MG capsule; Take 1 capsule (12.5 mg total) by mouth daily.  Dispense: 90 capsule; Refill: 1  2. Dyslipidemia Chronic.  Trolled.  Continue atorvastatin 20 mg 1 a day.  Will check lipid panel. - Lipid Panel With LDL/HDL Ratio - atorvastatin (LIPITOR) 20 MG tablet; TAKE 1  TABLET(20 MG) BY MOUTH DAILY  Dispense: 90 tablet; Refill: 1  3. Taking medication for chronic disease Patient on a statin will check hepatic function check for possibility of hepatotoxicity. - Hepatic function panel

## 2018-09-26 LAB — HEPATIC FUNCTION PANEL
ALT: 37 IU/L (ref 0–44)
AST: 34 IU/L (ref 0–40)
Alkaline Phosphatase: 79 IU/L (ref 39–117)
Bilirubin Total: 0.5 mg/dL (ref 0.0–1.2)
Bilirubin, Direct: 0.13 mg/dL (ref 0.00–0.40)
Total Protein: 7.4 g/dL (ref 6.0–8.5)

## 2018-09-26 LAB — LIPID PANEL WITH LDL/HDL RATIO
Cholesterol, Total: 193 mg/dL (ref 100–199)
HDL: 43 mg/dL (ref >39)
LDL Calculated: 121 mg/dL — ABNORMAL HIGH (ref 0–99)
LDl/HDL Ratio: 2.8 ratio (ref 0.0–3.6)
Triglycerides: 147 mg/dL (ref 0–149)
VLDL Cholesterol Cal: 29 mg/dL (ref 5–40)

## 2018-09-26 LAB — RENAL FUNCTION PANEL
Albumin: 4.7 g/dL (ref 3.8–4.8)
BUN/Creatinine Ratio: 11 (ref 10–24)
BUN: 12 mg/dL (ref 8–27)
CO2: 23 mmol/L (ref 20–29)
Calcium: 9.8 mg/dL (ref 8.6–10.2)
Chloride: 100 mmol/L (ref 96–106)
Creatinine, Ser: 1.05 mg/dL (ref 0.76–1.27)
GFR calc Af Amer: 87 mL/min/{1.73_m2} (ref >59)
GFR calc non Af Amer: 75 mL/min/{1.73_m2} (ref >59)
Glucose: 88 mg/dL (ref 65–99)
Phosphorus: 3.1 mg/dL (ref 2.8–4.1)
Potassium: 4.3 mmol/L (ref 3.5–5.2)
Sodium: 142 mmol/L (ref 134–144)

## 2019-04-02 ENCOUNTER — Ambulatory Visit (INDEPENDENT_AMBULATORY_CARE_PROVIDER_SITE_OTHER): Payer: BC Managed Care – PPO | Admitting: Family Medicine

## 2019-04-02 ENCOUNTER — Other Ambulatory Visit: Payer: Self-pay

## 2019-04-02 ENCOUNTER — Encounter: Payer: Self-pay | Admitting: Family Medicine

## 2019-04-02 VITALS — BP 120/74 | HR 64 | Ht 70.0 in | Wt 173.0 lb

## 2019-04-02 DIAGNOSIS — I10 Essential (primary) hypertension: Secondary | ICD-10-CM | POA: Diagnosis not present

## 2019-04-02 DIAGNOSIS — E785 Hyperlipidemia, unspecified: Secondary | ICD-10-CM

## 2019-04-02 MED ORDER — HYDROCHLOROTHIAZIDE 12.5 MG PO CAPS
12.5000 mg | ORAL_CAPSULE | Freq: Every day | ORAL | 1 refills | Status: DC
Start: 2019-04-02 — End: 2019-09-24

## 2019-04-02 MED ORDER — ATORVASTATIN CALCIUM 20 MG PO TABS: ORAL_TABLET | ORAL | 1 refills | Status: DC

## 2019-04-02 NOTE — Progress Notes (Signed)
Date:  04/02/2019   Name:  Kenneth Velasquez   DOB:  1955/08/26   MRN:  [id number]   Chief Complaint: Hypertension and Hyperlipidemia  Hypertension This is a chronic problem. The current episode started more than 1 year ago. The problem is unchanged. The problem is controlled. Pertinent negatives include no anxiety, blurred vision, chest pain, headaches, malaise/fatigue, neck pain, orthopnea, palpitations, peripheral edema, PND, shortness of breath or sweats. There are no associated agents to hypertension. Past treatments include diuretics. The current treatment provides mild improvement. There are no compliance problems.  There is no history of angina, kidney disease, CAD/MI, CVA, heart failure, left ventricular hypertrophy, PVD or retinopathy. There is no history of chronic renal disease, a hypertension causing med or renovascular disease.  Hyperlipidemia This is a chronic problem. The current episode started more than 1 year ago. The problem is controlled. Recent lipid tests were reviewed and are normal. He has no history of chronic renal disease, diabetes, hypothyroidism, liver disease, obesity or nephrotic syndrome. There are no known factors aggravating his hyperlipidemia. Pertinent negatives include no chest pain, focal sensory loss, focal weakness, leg pain, myalgias or shortness of breath. Current antihyperlipidemic treatment includes statins. The current treatment provides moderate improvement of lipids. There are no compliance problems.  Risk factors for coronary artery disease include hypertension.    Review of Systems  Constitutional: Negative for chills, fever and malaise/fatigue.  HENT: Negative for drooling, ear discharge, ear pain and sore throat.   Eyes: Negative for blurred vision.  Respiratory: Negative for cough, shortness of breath and wheezing.   Cardiovascular: Negative for chest pain, palpitations, orthopnea, leg swelling and PND.  Gastrointestinal: Negative for  abdominal pain, blood in stool, constipation, diarrhea and nausea.  Endocrine: Negative for polydipsia.  Genitourinary: Negative for dysuria, frequency, hematuria and urgency.  Musculoskeletal: Negative for back pain, myalgias and neck pain.  Skin: Negative for rash.  Allergic/Immunologic: Negative for environmental allergies.  Neurological: Negative for dizziness, focal weakness and headaches.  Hematological: Does not bruise/bleed easily.  Psychiatric/Behavioral: Negative for suicidal ideas. The patient is not nervous/anxious.     Patient Active Problem List   Diagnosis Date Noted  . Dyslipidemia 07/24/2017  . Routine general medical examination at a health care facility 11/08/2014  . Benign essential HTN 11/08/2014  . Screening for depression 11/08/2014  . Hypercholesteremia 11/08/2014  . H/O arthritis 11/08/2014    No Known Allergies  No past surgical history on file.  Social History   Tobacco Use  . Smoking status: Former Smoker    Types: Cigarettes  . Smokeless tobacco: Former Neurosurgeon    Quit date: 07/01/2008  Substance Use Topics  . Alcohol use: Yes    Alcohol/week: 0.0 standard drinks    Comment: occas  . Drug use: No     Medication list has been reviewed and updated.  Current Meds  Medication Sig  . aspirin 81 MG tablet Take 1 tablet by mouth daily.  Marland Kitchen atorvastatin (LIPITOR) 20 MG tablet TAKE 1 TABLET(20 MG) BY MOUTH DAILY  . hydrochlorothiazide (MICROZIDE) 12.5 MG capsule Take 1 capsule (12.5 mg total) by mouth daily.    PHQ 2/9 Scores 04/02/2019 09/25/2018 07/24/2017 09/01/2015  PHQ - 2 Score 0 0 0 0  PHQ- 9 Score 0 1 2 -    BP Readings from Last 3 Encounters:  04/02/19 120/74  09/25/18 130/80  03/24/18 130/82    Physical Exam  Wt Readings from Last 3 Encounters:  04/02/19 173 lb (78.5  kg)  09/25/18 177 lb (80.3 kg)  03/24/18 176 lb (79.8 kg)    BP 120/74   Pulse 64   Ht 5\' 10"  (1.778 m)   Wt 173 lb (78.5 kg)   BMI 24.82 kg/m   Assessment and  Plan: 1. Benign essential HTN Chronic.  Controlled.  Continue hydrochlorothiazide 12.5 mg once a day.  Reviewed previous renal function panel which is in normal limits.  Will recheck patient in 6 months and repeat labs at that time. - hydrochlorothiazide (MICROZIDE) 12.5 MG capsule; Take 1 capsule (12.5 mg total) by mouth daily.  Dispense: 90 capsule; Refill: 1  2. Dyslipidemia Chronic.  Controlled.  Continue atorvastatin 20 mg once a day.  Reviewed previous lipid panel which is in acceptable range and will recheck in 6 months. - atorvastatin (LIPITOR) 20 MG tablet; TAKE 1 TABLET(20 MG) BY MOUTH DAILY  Dispense: 90 tablet; Refill: 1

## 2019-09-14 ENCOUNTER — Encounter: Payer: Self-pay | Admitting: Family Medicine

## 2019-09-14 ENCOUNTER — Ambulatory Visit (INDEPENDENT_AMBULATORY_CARE_PROVIDER_SITE_OTHER): Payer: BC Managed Care – PPO | Admitting: Family Medicine

## 2019-09-14 ENCOUNTER — Other Ambulatory Visit: Payer: Self-pay

## 2019-09-14 VITALS — BP 120/70 | HR 60 | Ht 70.0 in | Wt 176.0 lb

## 2019-09-14 DIAGNOSIS — H9113 Presbycusis, bilateral: Secondary | ICD-10-CM | POA: Diagnosis not present

## 2019-09-14 DIAGNOSIS — K4090 Unilateral inguinal hernia, without obstruction or gangrene, not specified as recurrent: Secondary | ICD-10-CM

## 2019-09-14 NOTE — Addendum Note (Signed)
Addended by: Duanne Limerick on: 09/14/2019 09:37 AM   Modules accepted: Level of Service

## 2019-09-14 NOTE — Progress Notes (Signed)
Date:  09/14/2019   Name:  Kenneth Velasquez   DOB:  Nov 09, 1955   MRN:  [id number]   Chief Complaint: Hernia (knot on R) side/ inguinal area- picked up something heavy and noticed about 2 weeks ago.)  Abdominal Pain This is a new problem. The current episode started 1 to 4 weeks ago. The onset quality is sudden. The problem occurs constantly. The problem has been gradually worsening. Pain location: right inguinal. The pain is at a severity of 1/10. The pain is mild. Quality: "feels like a knot" Pertinent negatives include no anorexia, arthralgias, belching, constipation, diarrhea, dysuria, fever, flatus, frequency, headaches, hematochezia, hematuria, melena, myalgias, nausea, vomiting or weight loss. The pain is aggravated by coughing (bending /). He has tried nothing for the symptoms.    Lab Results  Component Value Date   CREATININE 1.05 09/25/2018   BUN 12 09/25/2018   NA 142 09/25/2018   K 4.3 09/25/2018   CL 100 09/25/2018   CO2 23 09/25/2018   Lab Results  Component Value Date   CHOL 193 09/25/2018   HDL 43 09/25/2018   LDLCALC 121 (H) 09/25/2018   TRIG 147 09/25/2018   CHOLHDL 5.6 (H) 07/24/2017   No results found for: TSH No results found for: HGBA1C Lab Results  Component Value Date   WBC 6.6 01/06/2015   HGB 14.9 01/06/2015   HCT 43.7 01/06/2015   MCV 90.8 01/06/2015   PLT 255 01/06/2015   Lab Results  Component Value Date   ALT 37 09/25/2018   AST 34 09/25/2018   ALKPHOS 79 09/25/2018   BILITOT 0.5 09/25/2018     Review of Systems  Constitutional: Negative for chills, fever and weight loss.  HENT: Negative for drooling, ear discharge, ear pain and sore throat.   Respiratory: Negative for cough, shortness of breath and wheezing.   Cardiovascular: Negative for chest pain, palpitations and leg swelling.  Gastrointestinal: Positive for abdominal pain. Negative for anorexia, blood in stool, constipation, diarrhea, flatus, hematochezia, melena, nausea and  vomiting.  Endocrine: Negative for polydipsia.  Genitourinary: Negative for dysuria, frequency, hematuria and urgency.  Musculoskeletal: Negative for arthralgias, back pain, myalgias and neck pain.  Skin: Negative for rash.  Allergic/Immunologic: Negative for environmental allergies.  Neurological: Negative for dizziness and headaches.  Hematological: Does not bruise/bleed easily.  Psychiatric/Behavioral: Negative for suicidal ideas. The patient is not nervous/anxious.     Patient Active Problem List   Diagnosis Date Noted  . Dyslipidemia 07/24/2017  . Routine general medical examination at a health care facility 11/08/2014  . Benign essential HTN 11/08/2014  . Screening for depression 11/08/2014  . Hypercholesteremia 11/08/2014  . H/O arthritis 11/08/2014    No Known Allergies  No past surgical history on file.  Social History   Tobacco Use  . Smoking status: Former Smoker    Types: Cigarettes  . Smokeless tobacco: Former Neurosurgeon    Quit date: 07/01/2008  Substance Use Topics  . Alcohol use: Yes    Alcohol/week: 0.0 standard drinks    Comment: occas  . Drug use: No     Medication list has been reviewed and updated.  Current Meds  Medication Sig  . aspirin 81 MG tablet Take 1 tablet by mouth daily.  Marland Kitchen atorvastatin (LIPITOR) 20 MG tablet TAKE 1 TABLET(20 MG) BY MOUTH DAILY  . hydrochlorothiazide (MICROZIDE) 12.5 MG capsule Take 1 capsule (12.5 mg total) by mouth daily.    PHQ 2/9 Scores 09/14/2019 04/02/2019 09/25/2018 07/24/2017  PHQ -  2 Score 0 0 0 0  PHQ- 9 Score 0 0 1 2    BP Readings from Last 3 Encounters:  09/14/19 120/70  04/02/19 120/74  09/25/18 130/80    Physical Exam Vitals and nursing note reviewed.  HENT:     Head: Normocephalic.     Right Ear: Tympanic membrane, ear canal and external ear normal. Decreased hearing noted.     Left Ear: Tympanic membrane, ear canal and external ear normal. Decreased hearing noted.     Nose: Nose normal.  Eyes:      General: No scleral icterus.       Right eye: No discharge.        Left eye: No discharge.     Conjunctiva/sclera: Conjunctivae normal.     Pupils: Pupils are equal, round, and reactive to light.  Neck:     Thyroid: No thyromegaly.     Vascular: No JVD.     Trachea: No tracheal deviation.  Cardiovascular:     Rate and Rhythm: Normal rate and regular rhythm.     Heart sounds: Normal heart sounds, S1 normal and S2 normal. No murmur. No systolic murmur. No diastolic murmur. No friction rub. No gallop. No S3 or S4 sounds.   Pulmonary:     Effort: No respiratory distress.     Breath sounds: Normal breath sounds. No wheezing or rales.  Abdominal:     General: Bowel sounds are normal.     Palpations: Abdomen is soft. There is no mass.     Tenderness: There is no abdominal tenderness. There is no guarding or rebound.     Hernia: A hernia is present. Hernia is present in the right inguinal area. There is no hernia in the left inguinal area.  Genitourinary:    Testes: Normal.        Right: Mass or tenderness not present.        Left: Mass or tenderness not present.     Epididymis:     Right: Normal.     Left: Normal.  Musculoskeletal:        General: No tenderness. Normal range of motion.     Cervical back: Normal range of motion and neck supple.     Right lower leg: No edema.     Left lower leg: No edema.  Lymphadenopathy:     Cervical: No cervical adenopathy.     Lower Body: No right inguinal adenopathy. No left inguinal adenopathy.  Skin:    General: Skin is warm.     Findings: No rash.  Neurological:     Mental Status: He is alert and oriented to person, place, and time.     Cranial Nerves: No cranial nerve deficit.     Deep Tendon Reflexes: Reflexes are normal and symmetric.     Wt Readings from Last 3 Encounters:  09/14/19 176 lb (79.8 kg)  04/02/19 173 lb (78.5 kg)  09/25/18 177 lb (80.3 kg)    BP 120/70   Pulse 60   Ht 5\' 10"  (1.778 m)   Wt 176 lb (79.8 kg)   BMI  25.25 kg/m   Assessment and Plan:  1. Non-recurrent unilateral inguinal hernia without obstruction or gangrene New onset.  Controlled.  Stable.  Patient has a knot which is developed in the right groin area when he was lifting a heavy object at work.  This exam and history is consistent with an inguinal hernia.  It is reducible and has minimal tenderness at this  time.  Patient has been given information on this and we will watch for another 3 months and recheck and if necessary refer to general surgery.  Patient has been given information and has been informed that it would be best if he could avoid lifting weight greater than 30 pounds.  2. Presbycusis of both ears New onset.  Stable.  Patient has been noted to have increasing hearing loss and has a job that he is in the presence of loud noises.  We have discussed the importance of wearing ear protection and patient does so at work.  If this does continue or should worsen we will refer to ear nose and throat.

## 2019-09-14 NOTE — Patient Instructions (Signed)
Presbycusis  Presbycusis is age-related hearing loss. This is a common condition that begins in middle age and develops slowly over time. Presbycusis usually affects both ears. This condition is permanent and cannot be corrected with surgery. However, hearing aids can help with this condition. What are the causes? This condition is caused by age-related changes in the inner or middle ear. These changes usually affect parts of the spiral-shaped cavity that translates sounds into nerve signals that are sent to the brain (cochlea). What increases the risk? The following factors may make you more likely to develop this condition:  Older age.  Being male.  Exposure to loud noises.  Exposure to medicines or substances that are poisonous (toxic) to organs in the ear. For example, chemotherapy drugs can sometimes affect hearing.  Having certain medical conditions, such as: ? High blood pressure (hypertension). ? Diabetes. ? Diseases that affect the body's disease-fighting system (immune diseases). ? Heart diseases. ? High cholesterol.  Smoking.  Having a history of ear infections.  Having a family history of age-related hearing loss. What are the signs or symptoms? The main symptom of this condition is hearing loss that develops slowly with age. Generally, it affects both ears. Signs of hearing loss can include:  Asking people to repeat themselves often.  Difficulty hearing people over the phone.  Difficulty following conversations when multiple people are talking.  Difficulty hearing when there is background noise, such as at a restaurant or bar.  Difficulty hearing high-pitched sounds, such as children speaking.  Turning the television volume up to levels that are higher than normal.  Talking loudly. Other symptoms include:  Roaring, ringing, hissing, or buzzing in the ears (tinnitus). How is this diagnosed? This condition may be diagnosed based on:  Your medical history.   An ear exam. Your health care provider will look into your ears to check whether there may be another cause of your hearing loss, such as earwax buildup.  A hearing test (audiogram). This test will see whether you can hear different tones at different pitches. Your health care provider may also refer you to an ear, nose, and throat specialist (otolaryngologist) or a hearing loss specialist (audiologist) who can assess your hearing loss and identify the cause. How is this treated? There is no cure for this condition, but treatment can help you hear better. Treatment may include:  Hearing aids.  A hearing device that is placed in the cochlea (cochlear implant).  Other devices to help you hear (assistive listening devices), such as electronic devices or apps.  Strategies to help with hearing in daily life (auditory rehabilitation).  Lip reading training. Follow these instructions at home: Communicating with others  Tell your family and friends about your hearing loss. Once they know, they will be able to communicate better with you and help you cope.  When talking with others, face them so you can see their lips move while they talk. Watch their hand gestures and facial expressions to help you interpret what they say. Ask people to: ? Get your attention before they start talking to you. ? Speak more clearly, slowly, or loudly (but not shout). ? Face you when they are talking. Coping with hearing loss  Consider using assistive listening devices at home and at work, such as: ? Electronic devices that are linked with hearing aids to help you hear on the telephone. These can also be hooked up to sound systems in certain places such as theaters. ? Apps for smartphones and tablets  that can help translate speech to text. ? Alerting devices. These play a loud sound or flash a light to indicate a doorbell or alarm.  Avoid loud noises.  Avoid or decrease background noise as much as possible.  ? Turn off the TV and radio when you are not actively listening to it. ? Avoid noisy spots in public places. For example, when in a restaurant, ask for a table away from the kitchen or bar.  Consider adding carpet to your home to absorb excess noise.  Consider adding extra lighting to your home to help you see people when they are talking to you. General instructions  If you need a hearing aid, have it fitted by a specialist. Do not buy hearing aids unless you have had a hearing aid evaluation.  Take over-the-counter and prescription medicines only as told by your health care provider.  Return to your normal activities as told by your health care provider. Ask your health care provider what activities are safe for you.  Keep all follow-up visits as told by your health care provider. This is important. Contact a health care provider if:  Your hearing gets worse, even with treatment.  You feel isolated or become depressed.  You have a fever.  You have new ringing or buzzing in your ear. Get help right away if:  You lose hearing suddenly (over a few hours or a day).  You have numbness, especially in parts of your face.  You have ear or head pain.  You are confused.  You are bleeding from your ear.  You experience dizziness on a regular basis. Summary  Presbycusis is age-related hearing loss that begins in middle age and develops slowly over time.  This condition is caused by age-related changes in the inner or middle ear.  You are more likely to develop this condition if you are older, male, take certain medicines, are exposed to loud noises, have certain health conditions, or have a family history of hearing loss.  Presbycusis usually affects both ears.  Treatment may include hearing aids, a hearing device that is placed in a cavity in the inner ear (cochlear implant), lifestyle changes, lip reading, and assistive listening devices. This information is not intended to  replace advice given to you by your health care provider. Make sure you discuss any questions you have with your health care provider. Document Revised: 05/30/2017 Document Reviewed: 07/26/2016 Elsevier Patient Education  [place]. Inguinal Hernia, Adult An inguinal hernia is when fat or your intestines push through a weak spot in a muscle where your leg meets your lower belly (groin). This causes a rounded lump (bulge). This kind of hernia could also be:  In your scrotum, if you are male.  In folds of skin around your vagina, if you are male. There are three types of inguinal hernias. These include:  Hernias that can be pushed back into the belly (are reducible). This type rarely causes pain.  Hernias that cannot be pushed back into the belly (are incarcerated).  Hernias that cannot be pushed back into the belly and lose their blood supply (are strangulated). This type needs emergency surgery. If you do not have symptoms, you may not need treatment. If you have symptoms or a large hernia, you may need surgery. Follow these instructions at home: Lifestyle  Do these things if told by your doctor so you do not have trouble pooping (constipation): ? Drink enough fluid to keep your pee (urine) pale yellow. ?  Eat foods that have a lot of fiber. These include fresh fruits and vegetables, whole grains, and beans. ? Limit foods that are high in fat and processed sugars. These include foods that are fried or sweet. ? Take medicine for trouble pooping.  Avoid lifting heavy objects.  Avoid standing for long amounts of time.  Do not use any products that contain nicotine or tobacco. These include cigarettes and e-cigarettes. If you need help quitting, ask your doctor.  Stay at a healthy weight. General instructions  You may try to push your hernia in by very gently pressing on it when you are lying down. Do not try to force the bulge back in if it will not push in easily.  Watch  your hernia for any changes in shape, size, or color. Tell your doctor if you see any changes.  Take over-the-counter and prescription medicines only as told by your doctor.  Keep all follow-up visits as told by your doctor. This is important. Contact a doctor if:  You have a fever.  You have new symptoms.  Your symptoms get worse. Get help right away if:  The area where your leg meets your lower belly has: ? Pain that gets worse suddenly. ? A bulge that gets bigger suddenly, and it does not get smaller after that. ? A bulge that turns red or purple. ? A bulge that is painful when you touch it.  You are a man, and your scrotum: ? Suddenly feels painful. ? Suddenly changes in size.  You cannot push the hernia in by very gently pressing on it when you are lying down. Do not try to force the bulge back in if it will not push in easily.  You feel sick to your stomach (nauseous), and that feeling does not go away.  You throw up (vomit), and that keeps happening.  You have a fast heartbeat.  You cannot poop (have a bowel movement) or pass gas. These symptoms may be an emergency. Do not wait to see if the symptoms will go away. Get medical help right away. Call your local emergency services (911 in the [place] Summary  An inguinal hernia is when fat or your intestines push through a weak spot in a muscle where your leg meets your lower belly (groin). This causes a rounded lump (bulge).  If you do not have symptoms, you may not need treatment. If you have symptoms or a large hernia, you may need surgery.  Avoid lifting heavy objects. Also avoid standing for long amounts of time.  Do not try to force the bulge back in if it will not push in easily. This information is not intended to replace advice given to you by your health care provider. Make sure you discuss any questions you have with your health care provider. Document Revised: 07/19/2017 Document Reviewed: 03/19/2017  Elsevier Patient Education  2020 [employer].

## 2019-09-24 ENCOUNTER — Ambulatory Visit: Payer: BC Managed Care – PPO | Admitting: Family Medicine

## 2019-09-24 ENCOUNTER — Encounter: Payer: Self-pay | Admitting: Family Medicine

## 2019-09-24 ENCOUNTER — Other Ambulatory Visit: Payer: Self-pay

## 2019-09-24 VITALS — BP 130/80 | HR 88 | Ht 70.0 in | Wt 174.0 lb

## 2019-09-24 DIAGNOSIS — E785 Hyperlipidemia, unspecified: Secondary | ICD-10-CM | POA: Diagnosis not present

## 2019-09-24 DIAGNOSIS — I10 Essential (primary) hypertension: Secondary | ICD-10-CM

## 2019-09-24 MED ORDER — ATORVASTATIN CALCIUM 20 MG PO TABS: ORAL_TABLET | ORAL | 1 refills | Status: DC

## 2019-09-24 MED ORDER — HYDROCHLOROTHIAZIDE 12.5 MG PO CAPS: 12.5000 mg | ORAL_CAPSULE | Freq: Every day | ORAL | 1 refills | Status: DC

## 2019-09-24 NOTE — Progress Notes (Signed)
Date:  09/24/2019   Name:  Kenneth Velasquez   DOB:  August 13, 1955   MRN:  [id number]   Chief Complaint: Hypertension and Hyperlipidemia  Hypertension This is a chronic problem. The current episode started more than 1 year ago. The problem has been gradually improving since onset. The problem is controlled. Pertinent negatives include no anxiety, blurred vision, chest pain, headaches, malaise/fatigue, neck pain, orthopnea, palpitations, peripheral edema, PND, shortness of breath or sweats. There are no associated agents to hypertension. There are no known risk factors for coronary artery disease. Past treatments include diuretics. The current treatment provides moderate improvement. There are no compliance problems.  There is no history of angina, kidney disease, CAD/MI, CVA, heart failure, left ventricular hypertrophy, PVD or retinopathy. There is no history of chronic renal disease, a hypertension causing med or renovascular disease.  Hyperlipidemia This is a chronic problem. The current episode started more than 1 year ago. The problem is controlled. Recent lipid tests were reviewed and are normal. He has no history of chronic renal disease, diabetes, hypothyroidism, liver disease, obesity or nephrotic syndrome. Factors aggravating his hyperlipidemia include thiazides. Pertinent negatives include no chest pain, myalgias or shortness of breath. Current antihyperlipidemic treatment includes statins. The current treatment provides moderate improvement of lipids. There are no compliance problems.  Risk factors for coronary artery disease include dyslipidemia and hypertension.    Lab Results  Component Value Date   CREATININE 1.05 09/25/2018   BUN 12 09/25/2018   NA 142 09/25/2018   K 4.3 09/25/2018   CL 100 09/25/2018   CO2 23 09/25/2018   Lab Results  Component Value Date   CHOL 193 09/25/2018   HDL 43 09/25/2018   LDLCALC 121 (H) 09/25/2018   TRIG 147 09/25/2018   CHOLHDL 5.6 (H)  07/24/2017   No results found for: TSH No results found for: HGBA1C Lab Results  Component Value Date   WBC 6.6 01/06/2015   HGB 14.9 01/06/2015   HCT 43.7 01/06/2015   MCV 90.8 01/06/2015   PLT 255 01/06/2015   Lab Results  Component Value Date   ALT 37 09/25/2018   AST 34 09/25/2018   ALKPHOS 79 09/25/2018   BILITOT 0.5 09/25/2018     Review of Systems  Constitutional: Negative for chills, fever and malaise/fatigue.  HENT: Negative for drooling, ear discharge, ear pain and sore throat.   Eyes: Negative for blurred vision.  Respiratory: Negative for cough, shortness of breath and wheezing.   Cardiovascular: Negative for chest pain, palpitations, orthopnea, leg swelling and PND.  Gastrointestinal: Negative for abdominal pain, blood in stool, constipation, diarrhea and nausea.  Endocrine: Negative for polydipsia.  Genitourinary: Negative for dysuria, frequency, hematuria and urgency.  Musculoskeletal: Negative for back pain, myalgias and neck pain.  Skin: Negative for rash.  Allergic/Immunologic: Negative for environmental allergies.  Neurological: Negative for dizziness and headaches.  Hematological: Does not bruise/bleed easily.  Psychiatric/Behavioral: Negative for suicidal ideas. The patient is not nervous/anxious.     Patient Active Problem List   Diagnosis Date Noted  . Dyslipidemia 07/24/2017  . Routine general medical examination at a health care facility 11/08/2014  . Benign essential HTN 11/08/2014  . Screening for depression 11/08/2014  . Hypercholesteremia 11/08/2014  . H/O arthritis 11/08/2014    No Known Allergies  No past surgical history on file.  Social History   Tobacco Use  . Smoking status: Former Smoker    Types: Cigarettes  . Smokeless tobacco: Former Systems developer  Quit date: 07/01/2008  Substance Use Topics  . Alcohol use: Yes    Alcohol/week: 0.0 standard drinks    Comment: occas  . Drug use: No     Medication list has been reviewed  and updated.  Current Meds  Medication Sig  . aspirin 81 MG tablet Take 1 tablet by mouth daily.  Marland Kitchen atorvastatin (LIPITOR) 20 MG tablet TAKE 1 TABLET(20 MG) BY MOUTH DAILY  . hydrochlorothiazide (MICROZIDE) 12.5 MG capsule Take 1 capsule (12.5 mg total) by mouth daily.    PHQ 2/9 Scores 09/24/2019 09/14/2019 04/02/2019 09/25/2018  PHQ - 2 Score 0 0 0 0  PHQ- 9 Score 0 0 0 1    BP Readings from Last 3 Encounters:  09/24/19 130/80  09/14/19 120/70  04/02/19 120/74    Physical Exam Vitals and nursing note reviewed.  HENT:     Head: Normocephalic.     Right Ear: Tympanic membrane, ear canal and external ear normal.     Left Ear: Tympanic membrane, ear canal and external ear normal.     Nose: Nose normal.     Mouth/Throat:     Mouth: Mucous membranes are moist.  Eyes:     General: No scleral icterus.       Right eye: No discharge.        Left eye: No discharge.     Conjunctiva/sclera: Conjunctivae normal.     Pupils: Pupils are equal, round, and reactive to light.  Neck:     Thyroid: No thyromegaly.     Vascular: No JVD.     Trachea: No tracheal deviation.  Cardiovascular:     Rate and Rhythm: Normal rate and regular rhythm.     Heart sounds: Normal heart sounds. No murmur. No friction rub. No gallop.   Pulmonary:     Effort: No respiratory distress.     Breath sounds: Normal breath sounds. No wheezing, rhonchi or rales.  Abdominal:     General: Bowel sounds are normal.     Palpations: Abdomen is soft. There is no mass.     Tenderness: There is no abdominal tenderness. There is no guarding or rebound.  Musculoskeletal:        General: No tenderness. Normal range of motion.     Cervical back: Normal range of motion and neck supple.  Lymphadenopathy:     Cervical: No cervical adenopathy.  Skin:    General: Skin is warm.     Capillary Refill: Capillary refill takes less than 2 seconds.     Findings: No rash.  Neurological:     Mental Status: He is alert and oriented to  person, place, and time.     Cranial Nerves: No cranial nerve deficit.     Deep Tendon Reflexes: Reflexes are normal and symmetric.     Wt Readings from Last 3 Encounters:  09/24/19 174 lb (78.9 kg)  09/14/19 176 lb (79.8 kg)  04/02/19 173 lb (78.5 kg)    BP 130/80   Pulse 88   Ht 5\' 10"  (1.778 m)   Wt 174 lb (78.9 kg)   BMI 24.97 kg/m   Assessment and Plan:  1. Dyslipidemia Chronic.  Controlled.  Stable.  Will continue atorvastatin 20 mg once a day.  Will recheck lipid panel with ratio.  We will recheck patient in 6 months - Lipid Panel With LDL/HDL Ratio - atorvastatin (LIPITOR) 20 MG tablet; TAKE 1 TABLET(20 MG) BY MOUTH DAILY  Dispense: 90 tablet; Refill: 1  2. Benign  essential HTN Chronic.  Controlled.  Stable.  Continue hydrochlorothiazide 12.5 mg once a day.  Will check comprehensive metabolic panel for GFR.  We will recheck patient in 6 months at which time we will also do a physical exam as well as medication refills - Comprehensive Metabolic Panel (CMET) - hydrochlorothiazide (MICROZIDE) 12.5 MG capsule; Take 1 capsule (12.5 mg total) by mouth daily.  Dispense: 90 capsule; Refill: 1

## 2019-09-25 LAB — COMPREHENSIVE METABOLIC PANEL
ALT: 21 IU/L (ref 0–44)
AST: 30 IU/L (ref 0–40)
Albumin/Globulin Ratio: 1.6 (ref 1.2–2.2)
Albumin: 4.5 g/dL (ref 3.8–4.8)
Alkaline Phosphatase: 76 IU/L (ref 39–117)
BUN/Creatinine Ratio: 9 — ABNORMAL LOW (ref 10–24)
BUN: 9 mg/dL (ref 8–27)
Bilirubin Total: 0.6 mg/dL (ref 0.0–1.2)
CO2: 23 mmol/L (ref 20–29)
Calcium: 9.5 mg/dL (ref 8.6–10.2)
Chloride: 102 mmol/L (ref 96–106)
Creatinine, Ser: 1.03 mg/dL (ref 0.76–1.27)
GFR calc Af Amer: 88 mL/min/{1.73_m2} (ref >59)
GFR calc non Af Amer: 76 mL/min/{1.73_m2} (ref >59)
Globulin, Total: 2.8 g/dL (ref 1.5–4.5)
Glucose: 93 mg/dL (ref 65–99)
Potassium: 4.3 mmol/L (ref 3.5–5.2)
Sodium: 139 mmol/L (ref 134–144)
Total Protein: 7.3 g/dL (ref 6.0–8.5)

## 2019-09-25 LAB — LIPID PANEL WITH LDL/HDL RATIO
Cholesterol, Total: 162 mg/dL (ref 100–199)
HDL: 44 mg/dL (ref >39)
LDL Chol Calc (NIH): 101 mg/dL — ABNORMAL HIGH (ref 0–99)
LDL/HDL Ratio: 2.3 ratio (ref 0.0–3.6)
Triglycerides: 89 mg/dL (ref 0–149)
VLDL Cholesterol Cal: 17 mg/dL (ref 5–40)

## 2020-03-31 ENCOUNTER — Encounter: Payer: Self-pay | Admitting: Family Medicine

## 2020-03-31 ENCOUNTER — Ambulatory Visit (INDEPENDENT_AMBULATORY_CARE_PROVIDER_SITE_OTHER): Payer: BC Managed Care – PPO | Admitting: Family Medicine

## 2020-03-31 ENCOUNTER — Other Ambulatory Visit: Payer: Self-pay | Admitting: Family Medicine

## 2020-03-31 ENCOUNTER — Other Ambulatory Visit: Payer: Self-pay

## 2020-03-31 VITALS — BP 120/70 | HR 80 | Ht 70.0 in | Wt 178.0 lb

## 2020-03-31 DIAGNOSIS — Z0000 Encounter for general adult medical examination without abnormal findings: Secondary | ICD-10-CM

## 2020-03-31 DIAGNOSIS — I10 Essential (primary) hypertension: Secondary | ICD-10-CM | POA: Diagnosis not present

## 2020-03-31 DIAGNOSIS — Z23 Encounter for immunization: Secondary | ICD-10-CM

## 2020-03-31 DIAGNOSIS — E785 Hyperlipidemia, unspecified: Secondary | ICD-10-CM | POA: Diagnosis not present

## 2020-03-31 DIAGNOSIS — R351 Nocturia: Secondary | ICD-10-CM

## 2020-03-31 MED ORDER — ATORVASTATIN CALCIUM 20 MG PO TABS: ORAL_TABLET | ORAL | 1 refills | Status: DC

## 2020-03-31 MED ORDER — HYDROCHLOROTHIAZIDE 12.5 MG PO CAPS: 12.5000 mg | ORAL_CAPSULE | Freq: Every day | ORAL | 1 refills | Status: DC

## 2020-03-31 NOTE — Progress Notes (Signed)
Date:  03/31/2020   Name:  Kenneth Velasquez   DOB:  Jul 06, 1955   MRN:  [id number]   Chief Complaint: Annual Exam, Hypertension, Hyperlipidemia, and Flu Vaccine  Hypertension This is a chronic problem. The current episode started more than 1 year ago. The problem has been gradually improving since onset. The problem is controlled. Pertinent negatives include no anxiety, blurred vision, chest pain, headaches, malaise/fatigue, neck pain, orthopnea, palpitations, peripheral edema, PND, shortness of breath or sweats. There are no associated agents to hypertension. There are no known risk factors for coronary artery disease. Past treatments include diuretics. The current treatment provides moderate improvement. There are no compliance problems.  There is no history of angina, kidney disease, CAD/MI, CVA, heart failure, left ventricular hypertrophy, PVD or retinopathy. There is no history of chronic renal disease, a hypertension causing med or renovascular disease.  Hyperlipidemia This is a chronic problem. The current episode started more than 1 year ago. The problem is controlled. Recent lipid tests were reviewed and are normal. He has no history of chronic renal disease. Factors aggravating his hyperlipidemia include thiazides. Pertinent negatives include no chest pain, myalgias or shortness of breath. Current antihyperlipidemic treatment includes statins.    Lab Results  Component Value Date   CREATININE 1.03 09/24/2019   BUN 9 09/24/2019   NA 139 09/24/2019   K 4.3 09/24/2019   CL 102 09/24/2019   CO2 23 09/24/2019   Lab Results  Component Value Date   CHOL 162 09/24/2019   HDL 44 09/24/2019   LDLCALC 101 (H) 09/24/2019   TRIG 89 09/24/2019   CHOLHDL 5.6 (H) 07/24/2017   No results found for: TSH No results found for: HGBA1C Lab Results  Component Value Date   WBC 6.6 01/06/2015   HGB 14.9 01/06/2015   HCT 43.7 01/06/2015   MCV 90.8 01/06/2015   PLT 255 01/06/2015   Lab  Results  Component Value Date   ALT 21 09/24/2019   AST 30 09/24/2019   ALKPHOS 76 09/24/2019   BILITOT 0.6 09/24/2019     Review of Systems  Constitutional: Negative for chills, fever and malaise/fatigue.  HENT: Negative for drooling, ear discharge, ear pain and sore throat.   Eyes: Negative for blurred vision.  Respiratory: Negative for cough, shortness of breath and wheezing.   Cardiovascular: Negative for chest pain, palpitations, orthopnea, leg swelling and PND.  Gastrointestinal: Negative for abdominal pain, blood in stool, constipation, diarrhea and nausea.  Endocrine: Negative for polydipsia.  Genitourinary: Negative for dysuria, frequency, hematuria and urgency.  Musculoskeletal: Negative for back pain, myalgias and neck pain.  Skin: Negative for rash.  Allergic/Immunologic: Negative for environmental allergies.  Neurological: Negative for dizziness and headaches.  Hematological: Does not bruise/bleed easily.  Psychiatric/Behavioral: Negative for suicidal ideas. The patient is not nervous/anxious.     Patient Active Problem List   Diagnosis Date Noted  . Dyslipidemia 07/24/2017  . Routine general medical examination at a health care facility 11/08/2014  . Benign essential HTN 11/08/2014  . Screening for depression 11/08/2014  . Hypercholesteremia 11/08/2014  . H/O arthritis 11/08/2014    No Known Allergies  No past surgical history on file.  Social History   Tobacco Use  . Smoking status: Former Smoker    Types: Cigarettes  . Smokeless tobacco: Former Neurosurgeon    Quit date: 07/01/2008  Substance Use Topics  . Alcohol use: Yes    Alcohol/week: 0.0 standard drinks    Comment: occas  . Drug  use: No     Medication list has been reviewed and updated.  Current Meds  Medication Sig  . aspirin 81 MG tablet Take 1 tablet by mouth daily.  Marland Kitchen. atorvastatin (LIPITOR) 20 MG tablet TAKE 1 TABLET(20 MG) BY MOUTH DAILY  . hydrochlorothiazide (MICROZIDE) 12.5 MG capsule  Take 1 capsule (12.5 mg total) by mouth daily.    PHQ 2/9 Scores 03/31/2020 09/24/2019 09/14/2019 04/02/2019  PHQ - 2 Score 0 0 0 0  PHQ- 9 Score 0 0 0 0    GAD 7 : Generalized Anxiety Score 03/31/2020 09/24/2019 09/14/2019  Nervous, Anxious, on Edge 0 0 0  Control/stop worrying 0 0 0  Worry too much - different things 0 0 0  Trouble relaxing 0 0 0  Restless 0 0 0  Easily annoyed or irritable 0 0 0  Afraid - awful might happen 0 0 0  Total GAD 7 Score 0 0 0    BP Readings from Last 3 Encounters:  03/31/20 120/70  09/24/19 130/80  09/14/19 120/70    Physical Exam Vitals and nursing note reviewed.  Constitutional:      Appearance: Normal appearance. He is well-developed, well-groomed and normal weight.  HENT:     Head: Normocephalic.     Jaw: There is normal jaw occlusion.     Salivary Glands: Right salivary gland is not diffusely enlarged or tender. Left salivary gland is not diffusely enlarged or tender.     Right Ear: Hearing and external ear normal. There is impacted cerumen.     Left Ear: Hearing and external ear normal. There is impacted cerumen.     Nose: Nose normal.  Eyes:     General: Lids are normal. Vision grossly intact. Gaze aligned appropriately. No visual field deficit or scleral icterus.       Right eye: No discharge.        Left eye: No discharge.     Extraocular Movements: Extraocular movements intact.     Conjunctiva/sclera: Conjunctivae normal.     Pupils: Pupils are equal, round, and reactive to light.  Neck:     Thyroid: No thyroid mass, thyromegaly or thyroid tenderness.     Vascular: Normal carotid pulses. No carotid bruit, hepatojugular reflux or JVD.     Trachea: Trachea normal. No tracheal deviation.  Cardiovascular:     Rate and Rhythm: Normal rate and regular rhythm.     Chest Wall: PMI is not displaced. No thrill.     Pulses: Normal pulses.          Carotid pulses are 2+ on the right side and 2+ on the left side.      Radial pulses are 2+ on  the right side and 2+ on the left side.       Femoral pulses are 2+ on the right side and 2+ on the left side.      Popliteal pulses are 2+ on the right side and 2+ on the left side.       Dorsalis pedis pulses are 2+ on the right side and 2+ on the left side.       Posterior tibial pulses are 2+ on the right side and 2+ on the left side.     Heart sounds: Normal heart sounds, S1 normal and S2 normal. Heart sounds not distant. No murmur heard.  No systolic murmur is present.  No diastolic murmur is present.  No friction rub. No gallop. No S3 or S4 sounds.  Pulmonary:     Effort: Pulmonary effort is normal. No respiratory distress.     Breath sounds: Normal breath sounds. No decreased air movement. No decreased breath sounds, wheezing, rhonchi or rales.  Chest:     Breasts: Breasts are symmetrical.        Right: Normal. No swelling or mass.        Left: Normal. No swelling or mass.  Abdominal:     General: Bowel sounds are normal.     Palpations: Abdomen is soft. There is no hepatomegaly, splenomegaly or mass.     Tenderness: There is no abdominal tenderness. There is no guarding or rebound.     Hernia: There is no hernia in the umbilical area, ventral area, left inguinal area or right inguinal area.  Genitourinary:    Penis: Normal and uncircumcised.      Testes: Normal.        Right: Mass, tenderness or swelling not present.        Left: Mass, tenderness or swelling not present.  Musculoskeletal:        General: No tenderness. Normal range of motion.     Cervical back: Full passive range of motion without pain, normal range of motion and neck supple.     Right lower leg: No edema.     Left lower leg: No edema.  Lymphadenopathy:     Head:     Right side of head: No submental or submandibular adenopathy.     Left side of head: No submental or submandibular adenopathy.     Cervical: No cervical adenopathy.     Right cervical: No superficial, deep or posterior cervical adenopathy.     Left cervical: No superficial, deep or posterior cervical adenopathy.     Upper Body:     Right upper body: No supraclavicular, axillary or pectoral adenopathy.     Left upper body: No supraclavicular, axillary or pectoral adenopathy.     Lower Body: No right inguinal adenopathy. No left inguinal adenopathy.  Skin:    General: Skin is warm.     Capillary Refill: Capillary refill takes less than 2 seconds.     Findings: No rash.  Neurological:     Mental Status: He is alert and oriented to person, place, and time.     Cranial Nerves: Cranial nerves are intact. No cranial nerve deficit.     Sensory: Sensation is intact.     Motor: Motor function is intact.     Deep Tendon Reflexes: Reflexes are normal and symmetric.     Reflex Scores:      Tricep reflexes are 2+ on the right side and 2+ on the left side.      Bicep reflexes are 2+ on the right side and 2+ on the left side.      Brachioradialis reflexes are 2+ on the right side and 2+ on the left side.      Patellar reflexes are 2+ on the right side and 2+ on the left side.      Achilles reflexes are 2+ on the right side and 2+ on the left side. Psychiatric:        Behavior: Behavior is cooperative.     Wt Readings from Last 3 Encounters:  03/31/20 178 lb (80.7 kg)  09/24/19 174 lb (78.9 kg)  09/14/19 176 lb (79.8 kg)    BP 120/70   Pulse 80   Ht 5\' 10"  (1.778 m)   Wt 178 lb (80.7 kg)  BMI 25.54 kg/m   Assessment and Plan: 1. Annual physical exam No subjective objective concerns noted on history and physical exam other than current cholesterol and blood pressure concerns. Patient's chart was reviewed for previous encounters as well as most recent labs, most recent imaging, and care everywhere.STERLIN KNIGHTLY is a 64 y.o. male who presents today for his Complete Annual Exam. He feels well. He reports exercising . He reports he is sleeping well. Immunizations are reviewed and recommendations provided.   Age appropriate  screening tests are discussed. Counseling given for risk factor reduction interventions. We will obtain a CMP review of electrolytes and GFR as well as noting liver function test given that he is on statin agent. - Comprehensive metabolic panel  2. Dyslipidemia Chronic. Controlled. Stable. Continue Lipitor 20 mg once a day. View of lipid panel from 6 months is acceptable. - atorvastatin (LIPITOR) 20 MG tablet; TAKE 1 TABLET(20 MG) BY MOUTH DAILY  Dispense: 90 tablet; Refill: 1 - Comprehensive metabolic panel  3. Benign essential HTN Chronic. Controlled. Stable. Blood pressure 120/70. Continue hydrochlorothiazide 12.5 mg once a day. Will check CMP for GFR and electrolytes. - hydrochlorothiazide (MICROZIDE) 12.5 MG capsule; Take 1 capsule (12.5 mg total) by mouth daily.  Dispense: 90 capsule; Refill: 1 - Comprehensive metabolic panel  4. Nocturia DRE is normal. Patient has history of mild nocturia and we will check PSA. - PSA  5. Need for immunization against influenza Discussed and administered. - Flu Vaccine QUAD 36+ mos IM

## 2020-04-01 LAB — COMPREHENSIVE METABOLIC PANEL
ALT: 23 IU/L (ref 0–44)
AST: 25 IU/L (ref 0–40)
Albumin/Globulin Ratio: 1.8 (ref 1.2–2.2)
Albumin: 4.6 g/dL (ref 3.8–4.8)
Alkaline Phosphatase: 78 IU/L (ref 44–121)
BUN/Creatinine Ratio: 15 (ref 10–24)
BUN: 16 mg/dL (ref 8–27)
Bilirubin Total: 0.4 mg/dL (ref 0.0–1.2)
CO2: 24 mmol/L (ref 20–29)
Calcium: 9.7 mg/dL (ref 8.6–10.2)
Chloride: 101 mmol/L (ref 96–106)
Creatinine, Ser: 1.06 mg/dL (ref 0.76–1.27)
GFR calc Af Amer: 85 mL/min/{1.73_m2} (ref >59)
GFR calc non Af Amer: 74 mL/min/{1.73_m2} (ref >59)
Globulin, Total: 2.6 g/dL (ref 1.5–4.5)
Glucose: 90 mg/dL (ref 65–99)
Potassium: 4.4 mmol/L (ref 3.5–5.2)
Sodium: 139 mmol/L (ref 134–144)
Total Protein: 7.2 g/dL (ref 6.0–8.5)

## 2020-04-01 LAB — PSA: Prostate Specific Ag, Serum: 0.8 ng/mL (ref 0.0–4.0)

## 2020-09-29 ENCOUNTER — Ambulatory Visit (INDEPENDENT_AMBULATORY_CARE_PROVIDER_SITE_OTHER): Payer: Medicare Other | Admitting: Family Medicine

## 2020-09-29 ENCOUNTER — Other Ambulatory Visit: Payer: Self-pay

## 2020-09-29 ENCOUNTER — Encounter: Payer: Self-pay | Admitting: Family Medicine

## 2020-09-29 VITALS — BP 126/82 | HR 74 | Ht 70.0 in | Wt 180.0 lb

## 2020-09-29 DIAGNOSIS — I10 Essential (primary) hypertension: Secondary | ICD-10-CM

## 2020-09-29 DIAGNOSIS — E785 Hyperlipidemia, unspecified: Secondary | ICD-10-CM

## 2020-09-29 DIAGNOSIS — J301 Allergic rhinitis due to pollen: Secondary | ICD-10-CM | POA: Diagnosis not present

## 2020-09-29 MED ORDER — HYDROCHLOROTHIAZIDE 12.5 MG PO CAPS
12.5000 mg | ORAL_CAPSULE | Freq: Every day | ORAL | 1 refills | Status: DC
Start: 2020-09-29 — End: 2021-03-20

## 2020-09-29 MED ORDER — FLUTICASONE PROPIONATE 50 MCG/ACT NA SUSP: 2.0000 | Freq: Every day | NASAL | 6 refills | Status: DC

## 2020-09-29 MED ORDER — MONTELUKAST SODIUM 10 MG PO TABS: 10.0000 mg | ORAL_TABLET | Freq: Every day | ORAL | 6 refills | Status: DC

## 2020-09-29 MED ORDER — MONTELUKAST SODIUM 10 MG PO TABS: 10.0000 mg | ORAL_TABLET | Freq: Every day | ORAL | 3 refills | Status: DC

## 2020-09-29 MED ORDER — ATORVASTATIN CALCIUM 20 MG PO TABS: ORAL_TABLET | ORAL | 1 refills | Status: DC

## 2020-09-29 NOTE — Progress Notes (Signed)
Date:  09/29/2020   Name:  Kenneth Velasquez   DOB:  08-28-1955   MRN:  [id number]   Chief Complaint: Hyperlipidemia, Hypertension, and Allergic Rhinitis  (Wants something sent in for runny nose and itchy eyes, cough from drainage)  Hyperlipidemia This is a chronic problem. The current episode started more than 1 year ago. The problem is controlled. Recent lipid tests were reviewed and are normal. He has no history of chronic renal disease, diabetes, hypothyroidism, liver disease, obesity or nephrotic syndrome. Factors aggravating his hyperlipidemia include thiazides. Pertinent negatives include no chest pain, focal sensory loss, focal weakness, leg pain, myalgias or shortness of breath. Current antihyperlipidemic treatment includes statins. The current treatment provides moderate improvement of lipids. There are no compliance problems.   Hypertension This is a chronic problem. The current episode started more than 1 year ago. The problem has been gradually improving since onset. The problem is controlled. Pertinent negatives include no anxiety, blurred vision, chest pain, headaches, malaise/fatigue, neck pain, orthopnea, palpitations, peripheral edema, PND, shortness of breath or sweats. There are no associated agents to hypertension. Risk factors for coronary artery disease include dyslipidemia and male gender. Past treatments include diuretics. The current treatment provides moderate improvement. There are no compliance problems.  There is no history of angina, kidney disease, CAD/MI, CVA, heart failure, left ventricular hypertrophy, PVD or retinopathy. There is no history of chronic renal disease, a hypertension causing med or renovascular disease.  URI  This is a recurrent (allergic rhinitis) problem. The current episode started more than 1 year ago. The problem has been gradually worsening. There has been no fever. Associated symptoms include congestion and rhinorrhea. Pertinent negatives include  no abdominal pain, chest pain, coughing, diarrhea, dysuria, ear pain, headaches, nausea, neck pain, plugged ear sensation, rash, sore throat or wheezing. He has tried nothing for the symptoms.    Lab Results  Component Value Date   CREATININE 1.06 03/31/2020   BUN 16 03/31/2020   NA 139 03/31/2020   K 4.4 03/31/2020   CL 101 03/31/2020   CO2 24 03/31/2020   Lab Results  Component Value Date   CHOL 162 09/24/2019   HDL 44 09/24/2019   LDLCALC 101 (H) 09/24/2019   TRIG 89 09/24/2019   CHOLHDL 5.6 (H) 07/24/2017   No results found for: TSH No results found for: HGBA1C Lab Results  Component Value Date   WBC 6.6 01/06/2015   HGB 14.9 01/06/2015   HCT 43.7 01/06/2015   MCV 90.8 01/06/2015   PLT 255 01/06/2015   Lab Results  Component Value Date   ALT 23 03/31/2020   AST 25 03/31/2020   ALKPHOS 78 03/31/2020   BILITOT 0.4 03/31/2020     Review of Systems  Constitutional: Negative for chills, fever and malaise/fatigue.  HENT: Positive for congestion and rhinorrhea. Negative for drooling, ear discharge, ear pain and sore throat.   Eyes: Negative for blurred vision.  Respiratory: Negative for cough, shortness of breath and wheezing.   Cardiovascular: Negative for chest pain, palpitations, orthopnea, leg swelling and PND.  Gastrointestinal: Negative for abdominal pain, blood in stool, constipation, diarrhea and nausea.  Endocrine: Negative for polydipsia.  Genitourinary: Negative for dysuria, frequency, hematuria and urgency.  Musculoskeletal: Negative for back pain, myalgias and neck pain.  Skin: Negative for rash.  Allergic/Immunologic: Negative for environmental allergies.  Neurological: Negative for dizziness, focal weakness and headaches.  Hematological: Does not bruise/bleed easily.  Psychiatric/Behavioral: Negative for suicidal ideas. The patient is not nervous/anxious.  Patient Active Problem List   Diagnosis Date Noted  . Dyslipidemia 07/24/2017  . Routine  general medical examination at a health care facility 11/08/2014  . Benign essential HTN 11/08/2014  . Screening for depression 11/08/2014  . Hypercholesteremia 11/08/2014  . H/O arthritis 11/08/2014    No Known Allergies  No past surgical history on file.  Social History   Tobacco Use  . Smoking status: Former Smoker    Types: Cigarettes  . Smokeless tobacco: Former Neurosurgeon    Quit date: 07/01/2008  Substance Use Topics  . Alcohol use: Yes    Alcohol/week: 0.0 standard drinks    Comment: occas  . Drug use: No     Medication list has been reviewed and updated.  Current Meds  Medication Sig  . aspirin 81 MG tablet Take 1 tablet by mouth daily.  Marland Kitchen atorvastatin (LIPITOR) 20 MG tablet TAKE 1 TABLET(20 MG) BY MOUTH DAILY  . hydrochlorothiazide (MICROZIDE) 12.5 MG capsule Take 1 capsule (12.5 mg total) by mouth daily.    PHQ 2/9 Scores 09/29/2020 03/31/2020 09/24/2019 09/14/2019  PHQ - 2 Score 0 0 0 0  PHQ- 9 Score 0 0 0 0    GAD 7 : Generalized Anxiety Score 09/29/2020 03/31/2020 09/24/2019 09/14/2019  Nervous, Anxious, on Edge 0 0 0 0  Control/stop worrying 0 0 0 0  Worry too much - different things 0 0 0 0  Trouble relaxing 0 0 0 0  Restless 0 0 0 0  Easily annoyed or irritable 0 0 0 0  Afraid - awful might happen 0 0 0 0  Total GAD 7 Score 0 0 0 0    BP Readings from Last 3 Encounters:  09/29/20 126/82  03/31/20 120/70  09/24/19 130/80    Physical Exam Vitals and nursing note reviewed.  Constitutional:      Appearance: He is normal weight.  HENT:     Head: Normocephalic.     Right Ear: Tympanic membrane and external ear normal.     Left Ear: Tympanic membrane and external ear normal.     Nose: Congestion present. No rhinorrhea.     Mouth/Throat:     Mouth: Mucous membranes are moist.  Eyes:     General: No scleral icterus.       Right eye: No discharge.        Left eye: No discharge.     Conjunctiva/sclera: Conjunctivae normal.     Pupils: Pupils are equal,  round, and reactive to light.  Neck:     Thyroid: No thyromegaly.     Vascular: No JVD.     Trachea: No tracheal deviation.  Cardiovascular:     Rate and Rhythm: Normal rate and regular rhythm.     Heart sounds: Normal heart sounds, S1 normal and S2 normal. No murmur heard.  No systolic murmur is present.  No diastolic murmur is present. No friction rub. No gallop. No S3 or S4 sounds.   Pulmonary:     Effort: No respiratory distress.     Breath sounds: Normal breath sounds. No wheezing, rhonchi or rales.  Abdominal:     General: Bowel sounds are normal.     Palpations: Abdomen is soft. There is no mass.     Tenderness: There is no abdominal tenderness. There is no guarding or rebound.  Musculoskeletal:        General: No tenderness. Normal range of motion.     Cervical back: Normal range of motion and neck supple.  Lymphadenopathy:     Cervical: No cervical adenopathy.  Skin:    General: Skin is warm.     Findings: No rash.  Neurological:     Mental Status: He is alert and oriented to person, place, and time.     Cranial Nerves: No cranial nerve deficit.     Deep Tendon Reflexes: Reflexes are normal and symmetric.     Wt Readings from Last 3 Encounters:  09/29/20 180 lb (81.6 kg)  03/31/20 178 lb (80.7 kg)  09/24/19 174 lb (78.9 kg)    BP 126/82   Pulse 74   Ht 5\' 10"  (1.778 m)   Wt 180 lb (81.6 kg)   BMI 25.83 kg/m   Assessment and Plan: 1. Benign essential HTN Chronic.  Controlled.  Stable.  Blood pressure 126/82.  Continue hydrochlorothiazide 12.5 mg once a day.  Will check renal function panel. - hydrochlorothiazide (MICROZIDE) 12.5 MG capsule; Take 1 capsule (12.5 mg total) by mouth daily.  Dispense: 90 capsule; Refill: 1 - Renal Function Panel  2. Dyslipidemia Chronic.  Controlled.  Stable.  Continue atorvastatin 20 mg once a day.  Will check lipid panel for current LDL status. - atorvastatin (LIPITOR) 20 MG tablet; TAKE 1 TABLET(20 MG) BY MOUTH DAILY   Dispense: 90 tablet; Refill: 1 - Lipid Panel With LDL/HDL Ratio  3. Seasonal allergic rhinitis due to pollen Chronic.  Recurrent.  Stable.  But it is currently partially controlled due to excessive tree pollen level.  Patient's been encouraged to obtain an over-the-counter nonsedating antihistamine and add today add Singulair 10 mg once a day and fluticasone nasal spray. - fluticasone (FLONASE) 50 MCG/ACT nasal spray; Place 2 sprays into both nostrils daily.  Dispense: 16 g; Refill: 6 - montelukast (SINGULAIR) 10 MG tablet; Take 1 tablet (10 mg total) by mouth at bedtime.  Dispense: 30 tablet; Refill: 6

## 2020-09-30 LAB — RENAL FUNCTION PANEL
Albumin: 4.7 g/dL (ref 3.8–4.8)
BUN/Creatinine Ratio: 13 (ref 10–24)
BUN: 14 mg/dL (ref 8–27)
CO2: 20 mmol/L (ref 20–29)
Calcium: 9.6 mg/dL (ref 8.6–10.2)
Chloride: 99 mmol/L (ref 96–106)
Creatinine, Ser: 1.07 mg/dL (ref 0.76–1.27)
Glucose: 88 mg/dL (ref 65–99)
Phosphorus: 3.7 mg/dL (ref 2.8–4.1)
Potassium: 3.9 mmol/L (ref 3.5–5.2)
Sodium: 139 mmol/L (ref 134–144)
eGFR: 77 mL/min/{1.73_m2} (ref >59)

## 2020-09-30 LAB — LIPID PANEL WITH LDL/HDL RATIO
Cholesterol, Total: 219 mg/dL — ABNORMAL HIGH (ref 100–199)
HDL: 39 mg/dL — ABNORMAL LOW (ref >39)
LDL Chol Calc (NIH): 147 mg/dL — ABNORMAL HIGH (ref 0–99)
LDL/HDL Ratio: 3.8 ratio — ABNORMAL HIGH (ref 0.0–3.6)
Triglycerides: 180 mg/dL — ABNORMAL HIGH (ref 0–149)
VLDL Cholesterol Cal: 33 mg/dL (ref 5–40)

## 2021-03-20 ENCOUNTER — Other Ambulatory Visit: Payer: Self-pay

## 2021-03-20 ENCOUNTER — Encounter: Payer: Self-pay | Admitting: Family Medicine

## 2021-03-20 ENCOUNTER — Ambulatory Visit (INDEPENDENT_AMBULATORY_CARE_PROVIDER_SITE_OTHER): Payer: Medicare Other | Admitting: Family Medicine

## 2021-03-20 VITALS — BP 130/80 | HR 72 | Ht 70.0 in | Wt 175.0 lb

## 2021-03-20 DIAGNOSIS — E785 Hyperlipidemia, unspecified: Secondary | ICD-10-CM

## 2021-03-20 DIAGNOSIS — I10 Essential (primary) hypertension: Secondary | ICD-10-CM | POA: Diagnosis not present

## 2021-03-20 DIAGNOSIS — J301 Allergic rhinitis due to pollen: Secondary | ICD-10-CM

## 2021-03-20 MED ORDER — MONTELUKAST SODIUM 10 MG PO TABS: 10.0000 mg | ORAL_TABLET | Freq: Every day | ORAL | 1 refills | Status: DC

## 2021-03-20 MED ORDER — ATORVASTATIN CALCIUM 20 MG PO TABS: ORAL_TABLET | ORAL | 1 refills | Status: DC

## 2021-03-20 MED ORDER — HYDROCHLOROTHIAZIDE 12.5 MG PO CAPS: 12.5000 mg | ORAL_CAPSULE | Freq: Every day | ORAL | 1 refills | Status: DC

## 2021-03-20 MED ORDER — FLUTICASONE PROPIONATE 50 MCG/ACT NA SUSP: 2.0000 | Freq: Every day | NASAL | 6 refills | Status: AC

## 2021-03-20 NOTE — Progress Notes (Signed)
Date:  03/20/2021   Name:  Kenneth Velasquez   DOB:  Sep 30, 1955   MRN:  [id number]   Chief Complaint: Hypertension, Hyperlipidemia, and Allergic Rhinitis   Hypertension This is a chronic problem. The current episode started more than 1 year ago. The problem is controlled. Pertinent negatives include no anxiety, blurred vision, chest pain, headaches, malaise/fatigue, neck pain, orthopnea, palpitations, peripheral edema, PND, shortness of breath or sweats. Risk factors for coronary artery disease include dyslipidemia and male gender. Past treatments include diuretics. The current treatment provides significant improvement. There are no compliance problems.  There is no history of angina, kidney disease, CAD/MI, CVA, heart failure, left ventricular hypertrophy, PVD or retinopathy. There is no history of chronic renal disease, a hypertension causing med or renovascular disease.  Hyperlipidemia This is a chronic problem. The current episode started more than 1 year ago. The problem is controlled. He has no history of chronic renal disease, diabetes, hypothyroidism, liver disease, obesity or nephrotic syndrome. Pertinent negatives include no chest pain, leg pain, myalgias or shortness of breath. Current antihyperlipidemic treatment includes statins. The current treatment provides moderate improvement of lipids. There are no compliance problems.  Risk factors for coronary artery disease include dyslipidemia and hypertension.   Lab Results  Component Value Date   CREATININE 1.07 09/29/2020   BUN 14 09/29/2020   NA 139 09/29/2020   K 3.9 09/29/2020   CL 99 09/29/2020   CO2 20 09/29/2020   Lab Results  Component Value Date   CHOL 219 (H) 09/29/2020   HDL 39 (L) 09/29/2020   LDLCALC 147 (H) 09/29/2020   TRIG 180 (H) 09/29/2020   CHOLHDL 5.6 (H) 07/24/2017   No results found for: TSH No results found for: HGBA1C Lab Results  Component Value Date   WBC 6.6 01/06/2015   HGB 14.9 01/06/2015    HCT 43.7 01/06/2015   MCV 90.8 01/06/2015   PLT 255 01/06/2015   Lab Results  Component Value Date   ALT 23 03/31/2020   AST 25 03/31/2020   ALKPHOS 78 03/31/2020   BILITOT 0.4 03/31/2020     Review of Systems  Constitutional:  Negative for diaphoresis, fatigue, fever and malaise/fatigue.  HENT:  Positive for hearing loss.   Eyes:  Negative for blurred vision.  Respiratory:  Negative for shortness of breath.   Cardiovascular:  Negative for chest pain, palpitations, orthopnea and PND.  Musculoskeletal:  Negative for myalgias and neck pain.  Neurological:  Negative for weakness and headaches.   Patient Active Problem List   Diagnosis Date Noted   Dyslipidemia 07/24/2017   Routine general medical examination at a health care facility 11/08/2014   Benign essential HTN 11/08/2014   Screening for depression 11/08/2014   Hypercholesteremia 11/08/2014   H/O arthritis 11/08/2014    No Known Allergies  No past surgical history on file.  Social History   Tobacco Use   Smoking status: Former    Types: Cigarettes   Smokeless tobacco: Former    Quit date: 07/01/2008  Substance Use Topics   Alcohol use: Yes    Alcohol/week: 0.0 standard drinks    Comment: occas   Drug use: No     Medication list has been reviewed and updated.  Current Meds  Medication Sig   aspirin 81 MG tablet Take 1 tablet by mouth daily.   atorvastatin (LIPITOR) 20 MG tablet TAKE 1 TABLET(20 MG) BY MOUTH DAILY   fluticasone (FLONASE) 50 MCG/ACT nasal spray Place 2 sprays into  both nostrils daily.   hydrochlorothiazide (MICROZIDE) 12.5 MG capsule Take 1 capsule (12.5 mg total) by mouth daily.   montelukast (SINGULAIR) 10 MG tablet Take 1 tablet (10 mg total) by mouth at bedtime.    PHQ 2/9 Scores 03/20/2021 09/29/2020 03/31/2020 09/24/2019  PHQ - 2 Score 0 0 0 0  PHQ- 9 Score 0 0 0 0    GAD 7 : Generalized Anxiety Score 03/20/2021 09/29/2020 03/31/2020 09/24/2019  Nervous, Anxious, on Edge 0 0 0 0   Control/stop worrying 0 0 0 0  Worry too much - different things 0 0 0 0  Trouble relaxing 0 0 0 0  Restless 0 0 0 0  Easily annoyed or irritable 0 0 0 0  Afraid - awful might happen 0 0 0 0  Total GAD 7 Score 0 0 0 0    BP Readings from Last 3 Encounters:  03/20/21 130/80  09/29/20 126/82  03/31/20 120/70    Physical Exam Vitals and nursing note reviewed.  HENT:     Head: Normocephalic.     Right Ear: Tympanic membrane, ear canal and external ear normal.     Left Ear: Tympanic membrane, ear canal and external ear normal.     Nose: Nose normal. No congestion or rhinorrhea.  Eyes:     General: No scleral icterus.       Right eye: No discharge.        Left eye: No discharge.     Conjunctiva/sclera: Conjunctivae normal.     Pupils: Pupils are equal, round, and reactive to light.  Neck:     Thyroid: No thyromegaly.     Vascular: No carotid bruit or JVD.     Trachea: No tracheal deviation.  Cardiovascular:     Rate and Rhythm: Normal rate and regular rhythm.     Pulses: Normal pulses.     Heart sounds: Normal heart sounds. No murmur heard.   No friction rub. No gallop.  Pulmonary:     Effort: No respiratory distress.     Breath sounds: Normal breath sounds. No wheezing, rhonchi or rales.  Abdominal:     General: Bowel sounds are normal.     Palpations: Abdomen is soft. There is no mass.     Tenderness: There is no abdominal tenderness. There is no guarding or rebound.  Musculoskeletal:        General: No tenderness. Normal range of motion.     Cervical back: Normal range of motion and neck supple.  Lymphadenopathy:     Cervical: No cervical adenopathy.  Skin:    General: Skin is warm.     Findings: No rash.  Neurological:     Mental Status: He is alert and oriented to person, place, and time.     Cranial Nerves: No cranial nerve deficit.     Deep Tendon Reflexes: Reflexes are normal and symmetric.    Wt Readings from Last 3 Encounters:  03/20/21 175 lb (79.4  kg)  09/29/20 180 lb (81.6 kg)  03/31/20 178 lb (80.7 kg)    BP 130/80   Pulse 72   Ht 5\' 10"  (1.778 m)   Wt 175 lb (79.4 kg)   BMI 25.11 kg/m   Assessment and Plan:  1. Benign essential HTN Chronic.  Controlled.  Stable.  Uncomplicated.  Blood pressure 130/80.  Continue hydrochlorothiazide 12.5 mg once a day.  Recheck in 6 months.  Will check CMP for electrolytes and GFR. - hydrochlorothiazide (MICROZIDE) 12.5 MG capsule;  Take 1 capsule (12.5 mg total) by mouth daily.  Dispense: 90 capsule; Refill: 1  2. Dyslipidemia Chronic.  Controlled.  Stable.  Last visit patient had not been taking medication patient is done better with medication administration during the past 6 months.  Continue atorvastatin 20 mg once a day.  Check lipid panel for current level of LDL control. - atorvastatin (LIPITOR) 20 MG tablet; TAKE 1 TABLET(20 MG) BY MOUTH DAILY  Dispense: 90 tablet; Refill: 1 - Lipid Panel With LDL/HDL Ratio  3. Seasonal allergic rhinitis due to pollen Chronic.  Controlled.  Stable.  Continue Flonase and Singulair 10 mg once a day. - fluticasone (FLONASE) 50 MCG/ACT nasal spray; Place 2 sprays into both nostrils daily.  Dispense: 16 g; Refill: 6 - montelukast (SINGULAIR) 10 MG tablet; Take 1 tablet (10 mg total) by mouth at bedtime.  Dispense: 90 tablet; Refill: 1 - Comprehensive Metabolic Panel (CMET)

## 2021-03-21 DIAGNOSIS — H2513 Age-related nuclear cataract, bilateral: Secondary | ICD-10-CM | POA: Diagnosis not present

## 2021-03-21 LAB — COMPREHENSIVE METABOLIC PANEL
ALT: 32 IU/L (ref 0–44)
AST: 30 IU/L (ref 0–40)
Albumin/Globulin Ratio: 1.8 (ref 1.2–2.2)
Albumin: 4.7 g/dL (ref 3.8–4.8)
Alkaline Phosphatase: 89 IU/L (ref 44–121)
BUN/Creatinine Ratio: 14 (ref 10–24)
BUN: 15 mg/dL (ref 8–27)
Bilirubin Total: 0.4 mg/dL (ref 0.0–1.2)
CO2: 25 mmol/L (ref 20–29)
Calcium: 10 mg/dL (ref 8.6–10.2)
Chloride: 97 mmol/L (ref 96–106)
Creatinine, Ser: 1.07 mg/dL (ref 0.76–1.27)
Globulin, Total: 2.6 g/dL (ref 1.5–4.5)
Glucose: 97 mg/dL (ref 65–99)
Potassium: 4.3 mmol/L (ref 3.5–5.2)
Sodium: 139 mmol/L (ref 134–144)
Total Protein: 7.3 g/dL (ref 6.0–8.5)
eGFR: 77 mL/min/{1.73_m2} (ref >59)

## 2021-03-21 LAB — LIPID PANEL WITH LDL/HDL RATIO
Cholesterol, Total: 201 mg/dL — ABNORMAL HIGH (ref 100–199)
HDL: 44 mg/dL (ref >39)
LDL Chol Calc (NIH): 121 mg/dL — ABNORMAL HIGH (ref 0–99)
LDL/HDL Ratio: 2.8 ratio (ref 0.0–3.6)
Triglycerides: 205 mg/dL — ABNORMAL HIGH (ref 0–149)
VLDL Cholesterol Cal: 36 mg/dL (ref 5–40)

## 2021-04-10 ENCOUNTER — Ambulatory Visit (INDEPENDENT_AMBULATORY_CARE_PROVIDER_SITE_OTHER): Payer: Medicare Other

## 2021-04-10 ENCOUNTER — Other Ambulatory Visit: Payer: Self-pay

## 2021-04-10 DIAGNOSIS — Z23 Encounter for immunization: Secondary | ICD-10-CM | POA: Diagnosis not present

## 2021-07-11 ENCOUNTER — Ambulatory Visit (INDEPENDENT_AMBULATORY_CARE_PROVIDER_SITE_OTHER): Payer: Medicare Other

## 2021-07-11 DIAGNOSIS — Z0000 Encounter for general adult medical examination without abnormal findings: Secondary | ICD-10-CM | POA: Diagnosis not present

## 2021-07-11 NOTE — Patient Instructions (Signed)
Kenneth Velasquez , Thank you for taking time to come for your Medicare Wellness Visit. I appreciate your ongoing commitment to your health goals. Please review the following plan we discussed and let me know if I can assist you in the future.   Screening recommendations/referrals: Colonoscopy: done 2010. Due for repeat screening.  Recommended yearly ophthalmology/optometry visit for glaucoma screening and checkup Recommended yearly dental visit for hygiene and checkup  Vaccinations: Influenza vaccine: done 04/10/21 Pneumococcal vaccine: declined Tdap vaccine: due Shingles vaccine: Shingrix discussed. Please contact your pharmacy for coverage information.  Covid-19:  declined  Advanced directives: Advance directive discussed with you today. Even though you declined this today please call our office should you change your mind and we can give you the proper paperwork for you to fill out.   Conditions/risks identified: Recommend increasing physical activity   Next appointment: Follow up in one year for your annual wellness visit.   Preventive Care 45 Years and Older, Male Preventive care refers to lifestyle choices and visits with your health care provider that can promote health and wellness. What does preventive care include? A yearly physical exam. This is also called an annual well check. Dental exams once or twice a year. Routine eye exams. Ask your health care provider how often you should have your eyes checked. Personal lifestyle choices, including: Daily care of your teeth and gums. Regular physical activity. Eating a healthy diet. Avoiding tobacco and drug use. Limiting alcohol use. Practicing safe sex. Taking low doses of aspirin every day. Taking vitamin and mineral supplements as recommended by your health care provider. What happens during an annual well check? The services and screenings done by your health care provider during your annual well check will depend on your age,  overall health, lifestyle risk factors, and family history of disease. Counseling  Your health care provider may ask you questions about your: Alcohol use. Tobacco use. Drug use. Emotional well-being. Home and relationship well-being. Sexual activity. Eating habits. History of falls. Memory and ability to understand (cognition). Work and work Statistician. Screening  You may have the following tests or measurements: Height, weight, and BMI. Blood pressure. Lipid and cholesterol levels. These may be checked every 5 years, or more frequently if you are over 52 years old. Skin check. Lung cancer screening. You may have this screening every year starting at age 101 if you have a 30-pack-year history of smoking and currently smoke or have quit within the past 15 years. Fecal occult blood test (FOBT) of the stool. You may have this test every year starting at age 48. Flexible sigmoidoscopy or colonoscopy. You may have a sigmoidoscopy every 5 years or a colonoscopy every 10 years starting at age 57. Prostate cancer screening. Recommendations will vary depending on your family history and other risks. Hepatitis C blood test. Hepatitis B blood test. Sexually transmitted disease (STD) testing. Diabetes screening. This is done by checking your blood sugar (glucose) after you have not eaten for a while (fasting). You may have this done every 1-3 years. Abdominal aortic aneurysm (AAA) screening. You may need this if you are a current or former smoker. Osteoporosis. You may be screened starting at age 79 if you are at high risk. Talk with your health care provider about your test results, treatment options, and if necessary, the need for more tests. Vaccines  Your health care provider may recommend certain vaccines, such as: Influenza vaccine. This is recommended every year. Tetanus, diphtheria, and acellular pertussis (Tdap, Td) vaccine.  You may need a Td booster every 10 years. Zoster vaccine.  You may need this after age 67. Pneumococcal 13-valent conjugate (PCV13) vaccine. One dose is recommended after age 23. Pneumococcal polysaccharide (PPSV23) vaccine. One dose is recommended after age 74. Talk to your health care provider about which screenings and vaccines you need and how often you need them. This information is not intended to replace advice given to you by your health care provider. Make sure you discuss any questions you have with your health care provider. Document Released: 07/14/2015 Document Revised: 03/06/2016 Document Reviewed: 04/18/2015 Elsevier Interactive Patient Education  2017 [place] Prevention in the Home Falls can cause injuries. They can happen to people of all ages. There are many things you can do to make your home safe and to help prevent falls. What can I do on the outside of my home? Regularly fix the edges of walkways and driveways and fix any cracks. Remove anything that might make you trip as you walk through a door, such as a raised step or threshold. Trim any bushes or trees on the path to your home. Use bright outdoor lighting. Clear any walking paths of anything that might make someone trip, such as rocks or tools. Regularly check to see if handrails are loose or broken. Make sure that both sides of any steps have handrails. Any raised decks and porches should have guardrails on the edges. Have any leaves, snow, or ice cleared regularly. Use sand or salt on walking paths during winter. Clean up any spills in your garage right away. This includes oil or grease spills. What can I do in the bathroom? Use night lights. Install grab bars by the toilet and in the tub and shower. Do not use towel bars as grab bars. Use non-skid mats or decals in the tub or shower. If you need to sit down in the shower, use a plastic, non-slip stool. Keep the floor dry. Clean up any water that spills on the floor as soon as it happens. Remove soap  buildup in the tub or shower regularly. Attach bath mats securely with double-sided non-slip rug tape. Do not have throw rugs and other things on the floor that can make you trip. What can I do in the bedroom? Use night lights. Make sure that you have a light by your bed that is easy to reach. Do not use any sheets or blankets that are too big for your bed. They should not hang down onto the floor. Have a firm chair that has side arms. You can use this for support while you get dressed. Do not have throw rugs and other things on the floor that can make you trip. What can I do in the kitchen? Clean up any spills right away. Avoid walking on wet floors. Keep items that you use a lot in easy-to-reach places. If you need to reach something above you, use a strong step stool that has a grab bar. Keep electrical cords out of the way. Do not use floor polish or wax that makes floors slippery. If you must use wax, use non-skid floor wax. Do not have throw rugs and other things on the floor that can make you trip. What can I do with my stairs? Do not leave any items on the stairs. Make sure that there are handrails on both sides of the stairs and use them. Fix handrails that are broken or loose. Make sure that handrails are as long as  the stairways. Check any carpeting to make sure that it is firmly attached to the stairs. Fix any carpet that is loose or worn. Avoid having throw rugs at the top or bottom of the stairs. If you do have throw rugs, attach them to the floor with carpet tape. Make sure that you have a light switch at the top of the stairs and the bottom of the stairs. If you do not have them, ask someone to add them for you. What else can I do to help prevent falls? Wear shoes that: Do not have high heels. Have rubber bottoms. Are comfortable and fit you well. Are closed at the toe. Do not wear sandals. If you use a stepladder: Make sure that it is fully opened. Do not climb a closed  stepladder. Make sure that both sides of the stepladder are locked into place. Ask someone to hold it for you, if possible. Clearly mark and make sure that you can see: Any grab bars or handrails. First and last steps. Where the edge of each step is. Use tools that help you move around (mobility aids) if they are needed. These include: Canes. Walkers. Scooters. Crutches. Turn on the lights when you go into a dark area. Replace any light bulbs as soon as they burn out. Set up your furniture so you have a clear path. Avoid moving your furniture around. If any of your floors are uneven, fix them. If there are any pets around you, be aware of where they are. Review your medicines with your doctor. Some medicines can make you feel dizzy. This can increase your chance of falling. Ask your doctor what other things that you can do to help prevent falls. This information is not intended to replace advice given to you by your health care provider. Make sure you discuss any questions you have with your health care provider. Document Released: 04/13/2009 Document Revised: 11/23/2015 Document Reviewed: 07/22/2014 Elsevier Interactive Patient Education  2017 [employer].

## 2021-07-11 NOTE — Progress Notes (Signed)
Subjective:   Kenneth Velasquez is a 66 y.o. male who presents for an Initial Medicare Annual Wellness Visit.  Virtual Visit via Telephone Note  I connected with  Kaamil Morefield Denzer on 07/11/21 at 10:00 AM EST by telephone and verified that I am speaking with the correct person using two identifiers.  Location: Patient: home Provider: [facility] Persons participating in the virtual visit: patient/Nurse Health Advisor   I discussed the limitations, risks, security and privacy concerns of performing an evaluation and management service by telephone and the availability of in person appointments. The patient expressed understanding and agreed to proceed.  Interactive audio and video telecommunications were attempted between this nurse and patient, however failed, due to patient having technical difficulties OR patient did not have access to video capability.  We continued and completed visit with audio only.  Some vital signs may be absent or patient reported.   Reather Littler, LPN   Review of Systems     Cardiac Risk Factors include: advanced age (>38men, >23 women);hypertension;dyslipidemia;male gender     Objective:    There were no vitals filed for this visit. There is no height or weight on file to calculate BMI.  Advanced Directives 07/11/2021 01/06/2015  Does Patient Have a Medical Advance Directive? No No  Would patient like information on creating a medical advance directive? No - Patient declined No - patient declined information    Current Medications (verified) Outpatient Encounter Medications as of 07/11/2021  Medication Sig   aspirin 81 MG tablet Take 1 tablet by mouth daily.   atorvastatin (LIPITOR) 20 MG tablet TAKE 1 TABLET(20 MG) BY MOUTH DAILY   hydrochlorothiazide (MICROZIDE) 12.5 MG capsule Take 1 capsule (12.5 mg total) by mouth daily.   fluticasone (FLONASE) 50 MCG/ACT nasal spray Place 2 sprays into both nostrils daily. (Patient not taking: Reported on 07/11/2021)    montelukast (SINGULAIR) 10 MG tablet Take 1 tablet (10 mg total) by mouth at bedtime. (Patient not taking: Reported on 07/11/2021)   No facility-administered encounter medications on file as of 07/11/2021.    Allergies (verified) Patient has no known allergies.   History: Past Medical History:  Diagnosis Date   Hyperlipidemia    Hypertension    History reviewed. No pertinent surgical history. History reviewed. No pertinent family history. Social History   Socioeconomic History   Marital status: Married    Spouse name: Not on file   Number of children: 3   Years of education: Not on file   Highest education level: Not on file  Occupational History   Not on file  Tobacco Use   Smoking status: Former    Types: Cigarettes   Smokeless tobacco: Former    Quit date: 07/01/2008  Vaping Use   Vaping Use: Never used  Substance and Sexual Activity   Alcohol use: Yes    Alcohol/week: 0.0 standard drinks    Comment: occas   Drug use: No   Sexual activity: Not on file  Other Topics Concern   Not on file  Social History Narrative   Not on file   Social Determinants of Health   Financial Resource Strain: Low Risk    Difficulty of Paying Living Expenses: Not hard at all  Food Insecurity: No Food Insecurity   Worried About Programme researcher, broadcasting/film/video in the Last Year: Never true   Ran Out of Food in the Last Year: Never true  Transportation Needs: No Transportation Needs   Lack of Transportation (Medical): No  Lack of Transportation (Non-Medical): No  Physical Activity: Inactive   Days of Exercise per Week: 0 days   Minutes of Exercise per Session: 0 min  Stress: No Stress Concern Present   Feeling of Stress : Not at all  Social Connections: Moderately Isolated   Frequency of Communication with Friends and Family: More than three times a week   Frequency of Social Gatherings with Friends and Family: More than three times a week   Attends Religious Services: Never   Doctor, general practiceActive Member of  Clubs or Organizations: No   Attends Engineer, structuralClub or Organization Meetings: Never   Marital Status: Married    Tobacco Counseling Counseling given: Not Answered   Clinical Intake:  Pre-visit preparation completed: Yes  Pain : No/denies pain     Nutritional Risks: None Diabetes: No  How often do you need to have someone help you when you read instructions, pamphlets, or other written materials from your doctor or pharmacy?: 1 - Never   Interpreter Needed?: No  Information entered by :: Reather LittlerKasey Munira Polson LPN   Activities of Daily Living In your present state of health, do you have any difficulty performing the following activities: 07/11/2021 09/29/2020  Hearing? N N  Vision? N N  Difficulty concentrating or making decisions? N N  Walking or climbing stairs? N N  Dressing or bathing? N N  Doing errands, shopping? N N  Preparing Food and eating ? N -  Using the Toilet? N -  In the past six months, have you accidently leaked urine? N -  Do you have problems with loss of bowel control? N -  Managing your Medications? N -  Managing your Finances? N -  Housekeeping or managing your Housekeeping? N -  Some recent data might be hidden    Patient Care Team: Duanne LimerickJones, Deanna C, MD as PCP - General (Family Medicine)  Indicate any recent Medical Services you may have received from other than [facility] providers in the past year (date may be approximate).     Assessment:   This is a routine wellness examination for Kenneth FallenKenneth.  Hearing/Vision screen Hearing Screening - Comments:: Pt denies hearing difficulty Vision Screening - Comments:: [facility] screenings [facility] at [facility]  Dietary issues and exercise activities discussed: Current Exercise Habits: The patient does not participate in regular exercise at present, Exercise limited by: None identified   Goals Addressed             This Visit's Progress    Increase physical activity       Recommend increasing physical activity  to at least 3 days per week        Depression Screen PHQ 2/9 Scores 07/11/2021 03/20/2021 09/29/2020 03/31/2020 09/24/2019 09/14/2019 04/02/2019  PHQ - 2 Score 0 0 0 0 0 0 0  PHQ- 9 Score - 0 0 0 0 0 0    Fall Risk Fall Risk  07/11/2021 03/20/2021 03/31/2020 09/24/2019 09/14/2019  Falls in the past year? 0 0 0 0 0  Number falls in past yr: 0 0 - - -  Injury with Fall? 0 0 - - -  Risk for fall due to : No Fall Risks No Fall Risks - - -  Follow up Falls prevention discussed Falls evaluation completed Falls evaluation completed Falls evaluation completed Falls evaluation completed    FALL RISK PREVENTION PERTAINING TO THE HOME:  Any stairs in or around the home? Yes  If so, are there any without handrails? No  Home free  of loose throw rugs in walkways, pet beds, electrical cords, etc? No  - advised to remove loose throw rugs Adequate lighting in your home to reduce risk of falls? Yes   ASSISTIVE DEVICES UTILIZED TO PREVENT FALLS:  Life alert? No  Use of a cane, walker or w/c? No  Grab bars in the bathroom? Yes  Shower chair or bench in shower? Yes  Elevated toilet seat or a handicapped toilet? Yes   TIMED UP AND GO:  Was the test performed? No . Telephonic visit.   Cognitive Function: Normal cognitive status assessed by direct observation by this Nurse Health Advisor. No abnormalities found.          Immunizations Immunization History  Administered Date(s) Administered   Fluad Quad(high Dose 65+) 04/10/2021   Influenza Inj Mdck Quad With Preservative 04/26/2018   Influenza Split 04/01/2019   Influenza,inj,Quad PF,6+ Mos 03/13/2016, 03/31/2020   Tdap 01/30/2015   Tdap vaccine status: up to date  Flu Vaccine status: Up to date  Pneumococcal vaccine status: Declined,  Education has been provided regarding the importance of this vaccine but patient still declined. Advised may receive this vaccine at local pharmacy or Health Dept. Aware to provide a copy of the vaccination record  if obtained from local pharmacy or Health Dept. Verbalized acceptance and understanding.   Covid-19 vaccine status: Declined, Education has been provided regarding the importance of this vaccine but patient still declined. Advised may receive this vaccine at local pharmacy or Health Dept.or vaccine clinic. Aware to provide a copy of the vaccination record if obtained from local pharmacy or Health Dept. Verbalized acceptance and understanding.  Qualifies for Shingles Vaccine? Yes   Zostavax completed No   Shingrix Completed?: No.    Education has been provided regarding the importance of this vaccine. Patient has been advised to call insurance company to determine out of pocket expense if they have not yet received this vaccine. Advised may also receive vaccine at local pharmacy or Health Dept. Verbalized acceptance and understanding.  Screening Tests Health Maintenance  Topic Date Due   Zoster Vaccines- Shingrix (1 of 2) Never done   COLONOSCOPY (Pts 45-32yrs Insurance coverage will need to be confirmed)  07/01/2018   Pneumonia Vaccine 68+ Years old (1 - PCV) Never done   TETANUS/TDAP  01/29/2025   INFLUENZA VACCINE  Completed   Hepatitis C Screening  Completed   HPV VACCINES  Aged Out   COVID-19 Vaccine  Discontinued    Health Maintenance  Health Maintenance Due  Topic Date Due   Zoster Vaccines- Shingrix (1 of 2) Never done   COLONOSCOPY (Pts 45-90yrs Insurance coverage will need to be confirmed)  07/01/2018   Pneumonia Vaccine 70+ Years old (1 - PCV) Never done    Colorectal cancer screening: Type of screening: Colonoscopy. Completed 2010. Repeat every 10 years. Pt declines repeat screening at this time. Plant to do FOBT.   Lung Cancer Screening: (Low Dose CT Chest recommended if Age 28-80 years, 30 pack-year currently smoking OR have quit w/in 15years.) does not qualify.   Additional Screening:  Hepatitis C Screening: does qualify; Completed 07/24/17  Vision Screening:  Recommended annual ophthalmology exams for early detection of glaucoma and other disorders of the eye. Is the patient up to date with their annual eye exam?  Yes  Who is the provider or what is the name of the office in which the patient attends annual eye exams? [facility].   Dental Screening: Recommended annual dental exams  for proper oral hygiene  Community Resource Referral / Chronic Care Management: CRR required this visit?  No   CCM required this visit?  No      Plan:     I have personally reviewed and noted the following in the patients chart:   Medical and social history Use of alcohol, tobacco or illicit drugs  Current medications and supplements including opioid prescriptions. Patient is not currently taking opioid prescriptions. Functional ability and status Nutritional status Physical activity Advanced directives List of other physicians Hospitalizations, surgeries, and ER visits in previous 12 months Vitals Screenings to include cognitive, depression, and falls Referrals and appointments  In addition, I have reviewed and discussed with patient certain preventive protocols, quality metrics, and best practice recommendations. A written personalized care plan for preventive services as well as general preventive health recommendations were provided to patient.   Due to this being a telephonic visit, the after visit summary with patients personalized plan was offered to patient via mail or my-chart. Patient declined at this time.  Reather LittlerKasey Charels Stambaugh, LPN   [id number]/62951/05/2022   Nurse Notes: none

## 2021-08-15 ENCOUNTER — Ambulatory Visit: Payer: Self-pay | Admitting: *Deleted

## 2021-08-15 NOTE — Telephone Encounter (Signed)
Reason for Disposition  Mild widespread rash  Answer Assessment - Initial Assessment Questions 1. APPEARANCE of RASH: "Describe the rash." (e.g., spots, blisters, raised areas, skin peeling, scaly)     Little red bump- larger neck and chest 2. SIZE: "How big are the spots?" (e.g., tip of pen, eraser, coin; inches, centimeters)     *No Answer* 3. LOCATION: "Where is the rash located?"     Arms, chest, neck 4. COLOR: "What color is the rash?" (Note: It is difficult to assess rash color in people with darker-colored skin. When this situation occurs, simply ask the caller to describe what they see.)     *No Answer* 5. ONSET: "When did the rash begin?"     Monday 6. FEVER: "Do you have a fever?" If Yes, ask: "What is your temperature, how was it measured, and when did it start?"     no 7. ITCHING: "Does the rash itch?" If Yes, ask: "How bad is the itch?" (Scale 1-10; or mild, moderate, severe)     mild 8. CAUSE: "What do you think is causing the rash?"     unsure 9. MEDICINE FACTORS: "Have you started any new medicines within the last 2 weeks?" (e.g., antibiotics)      Switched branding on his BP medication 10. OTHER SYMPTOMS: "Do you have any other symptoms?" (e.g., dizziness, headache, sore throat, joint pain)       no 11. PREGNANCY: "Is there any chance you are pregnant?" "When was your last menstrual period?"       *No Answer*  Protocols used: Rash or Redness - [facility]

## 2021-08-15 NOTE — Telephone Encounter (Signed)
°  Chief Complaint: widespread rash Symptoms: mild itching, red bumps- vary in size Frequency: started Monday  Pertinent Negatives: Patient denies  dizziness, headache, sore throat, joint pain Disposition: [] ED /[] Urgent Care (no appt availability in office) / [x] Appointment(In office/virtual)/ []  [facility]/ [] Home Care/ [] Refused Recommended Disposition /[]  Mobile Bus/ []  Follow-up with PCP Additional Notes:

## 2021-08-16 ENCOUNTER — Encounter: Payer: Self-pay | Admitting: Family Medicine

## 2021-08-16 ENCOUNTER — Other Ambulatory Visit: Payer: Self-pay

## 2021-08-16 ENCOUNTER — Ambulatory Visit (INDEPENDENT_AMBULATORY_CARE_PROVIDER_SITE_OTHER): Payer: Medicare Other | Admitting: Family Medicine

## 2021-08-16 VITALS — BP 144/80 | HR 80 | Ht 70.0 in | Wt 181.0 lb

## 2021-08-16 DIAGNOSIS — I10 Essential (primary) hypertension: Secondary | ICD-10-CM

## 2021-08-16 DIAGNOSIS — T7840XA Allergy, unspecified, initial encounter: Secondary | ICD-10-CM

## 2021-08-16 DIAGNOSIS — K219 Gastro-esophageal reflux disease without esophagitis: Secondary | ICD-10-CM | POA: Diagnosis not present

## 2021-08-16 MED ORDER — CETIRIZINE HCL 10 MG PO TABS: 10.0000 mg | ORAL_TABLET | Freq: Every day | ORAL | 11 refills | Status: AC

## 2021-08-16 MED ORDER — PANTOPRAZOLE SODIUM 40 MG PO TBEC: 40.0000 mg | DELAYED_RELEASE_TABLET | Freq: Every day | ORAL | 3 refills | Status: DC

## 2021-08-16 MED ORDER — PREDNISONE 10 MG PO TABS: ORAL_TABLET | ORAL | 1 refills | Status: DC

## 2021-08-16 NOTE — Progress Notes (Signed)
Date:  08/16/2021   Name:  Kenneth Velasquez   DOB:  August 25, 1955   MRN:  [id number]   Chief Complaint: Rash (Worse on chest and gets worse at night when he gets hot. Did not take med this am. Rash has been there x 5 days.) and Gastroesophageal Reflux (Is taking otc but only helps some)  Rash This is a new problem. The current episode started in the past 7 days. The problem is unchanged. The rash is diffuse. The rash is characterized by itchiness. He was exposed to a new medication (new type of hctz). Pertinent negatives include no cough, diarrhea, fever, shortness of breath or sore throat. Past treatments include nothing.  Gastroesophageal Reflux He complains of heartburn. He reports no abdominal pain, no chest pain, no coughing, no dysphagia, no nausea, no sore throat or no wheezing. This is a new problem. The heartburn is of mild intensity.   Lab Results  Component Value Date   NA 139 03/20/2021   K 4.3 03/20/2021   CO2 25 03/20/2021   GLUCOSE 97 03/20/2021   BUN 15 03/20/2021   CREATININE 1.07 03/20/2021   CALCIUM 10.0 03/20/2021   EGFR 77 03/20/2021   GFRNONAA 74 03/31/2020   Lab Results  Component Value Date   CHOL 201 (H) 03/20/2021   HDL 44 03/20/2021   LDLCALC 121 (H) 03/20/2021   TRIG 205 (H) 03/20/2021   CHOLHDL 5.6 (H) 07/24/2017   No results found for: TSH No results found for: HGBA1C Lab Results  Component Value Date   WBC 6.6 01/06/2015   HGB 14.9 01/06/2015   HCT 43.7 01/06/2015   MCV 90.8 01/06/2015   PLT 255 01/06/2015   Lab Results  Component Value Date   ALT 32 03/20/2021   AST 30 03/20/2021   ALKPHOS 89 03/20/2021   BILITOT 0.4 03/20/2021   No results found for: 25OHVITD2, 25OHVITD3, VD25OH   Review of Systems  Constitutional:  Negative for chills and fever.  HENT:  Negative for drooling, ear discharge, ear pain and sore throat.   Respiratory:  Negative for cough, shortness of breath and wheezing.   Cardiovascular:  Negative for chest pain,  palpitations and leg swelling.  Gastrointestinal:  Positive for heartburn. Negative for abdominal pain, blood in stool, constipation, diarrhea, dysphagia and nausea.  Endocrine: Negative for polydipsia.  Genitourinary:  Negative for dysuria, frequency, hematuria and urgency.  Musculoskeletal:  Negative for back pain, myalgias and neck pain.  Skin:  Positive for rash.  Allergic/Immunologic: Negative for environmental allergies.  Neurological:  Negative for dizziness and headaches.  Hematological:  Does not bruise/bleed easily.  Psychiatric/Behavioral:  Negative for suicidal ideas. The patient is not nervous/anxious.    Patient Active Problem List   Diagnosis Date Noted   Dyslipidemia 07/24/2017   Routine general medical examination at a health care facility 11/08/2014   Benign essential HTN 11/08/2014   Screening for depression 11/08/2014   Hypercholesteremia 11/08/2014   H/O arthritis 11/08/2014    Allergies  Allergen Reactions   Hydrochlorothiazide Rash    No past surgical history on file.  Social History   Tobacco Use   Smoking status: Former    Types: Cigarettes   Smokeless tobacco: Former    Quit date: 07/01/2008  Vaping Use   Vaping Use: Never used  Substance Use Topics   Alcohol use: Yes    Alcohol/week: 0.0 standard drinks    Comment: occas   Drug use: No     Medication list  has been reviewed and updated.  Current Meds  Medication Sig   aspirin 81 MG tablet Take 1 tablet by mouth daily.   atorvastatin (LIPITOR) 20 MG tablet TAKE 1 TABLET(20 MG) BY MOUTH DAILY   [DISCONTINUED] hydrochlorothiazide (MICROZIDE) 12.5 MG capsule Take 1 capsule (12.5 mg total) by mouth daily.    PHQ 2/9 Scores 07/11/2021 03/20/2021 09/29/2020 03/31/2020  PHQ - 2 Score 0 0 0 0  PHQ- 9 Score - 0 0 0    GAD 7 : Generalized Anxiety Score 03/20/2021 09/29/2020 03/31/2020 09/24/2019  Nervous, Anxious, on Edge 0 0 0 0  Control/stop worrying 0 0 0 0  Worry too much - different things 0 0 0 0   Trouble relaxing 0 0 0 0  Restless 0 0 0 0  Easily annoyed or irritable 0 0 0 0  Afraid - awful might happen 0 0 0 0  Total GAD 7 Score 0 0 0 0    BP Readings from Last 3 Encounters:  08/16/21 (!) 144/80  03/20/21 130/80  09/29/20 126/82    Physical Exam Vitals and nursing note reviewed.  HENT:     Head: Normocephalic.     Right Ear: Tympanic membrane and external ear normal.     Left Ear: Tympanic membrane and external ear normal.     Nose: Nose normal. No congestion or rhinorrhea.  Eyes:     General: No scleral icterus.       Right eye: No discharge.        Left eye: No discharge.     Conjunctiva/sclera: Conjunctivae normal.     Pupils: Pupils are equal, round, and reactive to light.  Neck:     Thyroid: No thyromegaly.     Vascular: No JVD.     Trachea: No tracheal deviation.  Cardiovascular:     Rate and Rhythm: Normal rate and regular rhythm.     Heart sounds: Normal heart sounds. No murmur heard.   No friction rub. No gallop.  Pulmonary:     Effort: No respiratory distress.     Breath sounds: Normal breath sounds. No wheezing, rhonchi or rales.  Abdominal:     General: Bowel sounds are normal.     Palpations: Abdomen is soft. There is no mass.     Tenderness: There is no abdominal tenderness. There is no guarding or rebound.  Musculoskeletal:        General: No tenderness. Normal range of motion.     Cervical back: Normal range of motion and neck supple.  Lymphadenopathy:     Cervical: No cervical adenopathy.  Skin:    General: Skin is warm.     Findings: Rash present. Rash is macular and papular.  Neurological:     Mental Status: He is alert and oriented to person, place, and time.     Cranial Nerves: No cranial nerve deficit.     Deep Tendon Reflexes: Reflexes are normal and symmetric.    Wt Readings from Last 3 Encounters:  08/16/21 181 lb (82.1 kg)  03/20/21 175 lb (79.4 kg)  09/29/20 180 lb (81.6 kg)    BP (!) 144/80    Pulse 80    Ht 5' 10"   (1.778 m)    Wt 181 lb (82.1 kg)    BMI 25.97 kg/m   Assessment and Plan:  1. Allergic reaction to drug, initial encounter New onset.  Persistent.  Stable.  Patient began having a pruritic rash generalized 4 to 5 days ago.  Patient has been on hydrochlorothiazide for several weeks but this has been the most recent medication that has been started.  I suspect he is allergic to hydrochlorothiazide this has been discontinued and we will put him on a prednisone taper and have encouraged him to take Zyrtec for the pruritus. - predniSONE (DELTASONE) 10 MG tablet; Taper 6,6,6,5,5,5,4,4,3,3,2,2,1,1  Dispense: 53 tablet; Refill: 1 - cetirizine (ZYRTEC) 10 MG tablet; Take 1 tablet (10 mg total) by mouth daily.  Dispense: 30 tablet; Refill: 11  2. Gastroesophageal reflux disease, unspecified whether esophagitis present Chronic.  Uncontrolled.  Relatively stable.  Patient has not had any relief with over-the-counter medications and we will initiate pantoprazole 40 mg once a day.  We will recheck patient on March 21 as to results. - pantoprazole (PROTONIX) 40 MG tablet; Take 1 tablet (40 mg total) by mouth daily.  Dispense: 30 tablet; Refill: 3  3. Benign essential HTN Chronic.  Relatively controlled not on medication today.  Even though is mildly elevated I do not want to restart a new medication as were trying to figure out what is going on with this rash which I suspect is due to the hydrochlorothiazide as after mentioned.  We will recheck patient next month and if blood pressure is elevated will likely initiate amlodipine or lisinopril.

## 2021-09-18 ENCOUNTER — Ambulatory Visit (INDEPENDENT_AMBULATORY_CARE_PROVIDER_SITE_OTHER): Payer: Medicare Other | Admitting: Family Medicine

## 2021-09-18 ENCOUNTER — Other Ambulatory Visit: Payer: Self-pay

## 2021-09-18 ENCOUNTER — Encounter: Payer: Self-pay | Admitting: Family Medicine

## 2021-09-18 VITALS — BP 142/84 | HR 72 | Ht 70.0 in | Wt 183.0 lb

## 2021-09-18 DIAGNOSIS — K219 Gastro-esophageal reflux disease without esophagitis: Secondary | ICD-10-CM

## 2021-09-18 DIAGNOSIS — E785 Hyperlipidemia, unspecified: Secondary | ICD-10-CM | POA: Diagnosis not present

## 2021-09-18 DIAGNOSIS — I10 Essential (primary) hypertension: Secondary | ICD-10-CM | POA: Diagnosis not present

## 2021-09-18 MED ORDER — ATORVASTATIN CALCIUM 20 MG PO TABS: ORAL_TABLET | ORAL | 1 refills | Status: DC

## 2021-09-18 MED ORDER — PANTOPRAZOLE SODIUM 40 MG PO TBEC: 40.0000 mg | DELAYED_RELEASE_TABLET | Freq: Every day | ORAL | 1 refills | Status: DC

## 2021-09-18 MED ORDER — AMLODIPINE BESYLATE 2.5 MG PO TABS: 2.5000 mg | ORAL_TABLET | Freq: Every day | ORAL | 1 refills | Status: DC

## 2021-09-18 NOTE — Progress Notes (Addendum)
? ? ?Date:  09/18/2021  ? ?Name:  Kenneth Velasquez   DOB:  Nov 13, 1955   MRN:  [id number] ? ? ?Chief Complaint: Gastroesophageal Reflux, Hyperlipidemia, and Hypertension (Stopped HCTZ due to rash) ? ?Gastroesophageal Reflux ?He reports no abdominal pain, no belching, no chest pain, no choking, no coughing, no dysphagia, no early satiety, no heartburn, no nausea, no sore throat or no wheezing. This is a chronic problem. The current episode started more than 1 year ago. The problem has been gradually improving. Nothing aggravates the symptoms. He has tried a PPI for the symptoms. The treatment provided moderate relief.  ?Hyperlipidemia ?This is a chronic problem. The current episode started more than 1 year ago. The problem is controlled. Recent lipid tests were reviewed and are normal. He has no history of chronic renal disease or diabetes. Exacerbated by: off hctz. Pertinent negatives include no chest pain, myalgias or shortness of breath. Current antihyperlipidemic treatment includes statins. The current treatment provides moderate improvement of lipids.  ?Hypertension ?This is a chronic problem. The current episode started more than 1 year ago. The problem has been waxing and waning since onset. The problem is controlled. Pertinent negatives include no blurred vision, chest pain, headaches, malaise/fatigue, neck pain, orthopnea, palpitations or shortness of breath. There are no associated agents to hypertension. Risk factors for coronary artery disease include dyslipidemia. Treatments tried: off thiazide/rash. There is no history of chronic renal disease.  ? ?Lab Results  ?Component Value Date  ? NA 139 03/20/2021  ? K 4.3 03/20/2021  ? CO2 25 03/20/2021  ? GLUCOSE 97 03/20/2021  ? BUN 15 03/20/2021  ? CREATININE 1.07 03/20/2021  ? CALCIUM 10.0 03/20/2021  ? EGFR 77 03/20/2021  ? GFRNONAA 74 03/31/2020  ? ?Lab Results  ?Component Value Date  ? CHOL 201 (H) 03/20/2021  ? HDL 44 03/20/2021  ? LDLCALC 121 (H) 03/20/2021   ? TRIG 205 (H) 03/20/2021  ? CHOLHDL 5.6 (H) 07/24/2017  ? ?No results found for: TSH ?No results found for: HGBA1C ?Lab Results  ?Component Value Date  ? WBC 6.6 01/06/2015  ? HGB 14.9 01/06/2015  ? HCT 43.7 01/06/2015  ? MCV 90.8 01/06/2015  ? PLT 255 01/06/2015  ? ?Lab Results  ?Component Value Date  ? ALT 32 03/20/2021  ? AST 30 03/20/2021  ? ALKPHOS 89 03/20/2021  ? BILITOT 0.4 03/20/2021  ? ?No results found for: 25OHVITD2, [place], VD25OH  ? ?Review of Systems  ?Constitutional:  Negative for chills, fever and malaise/fatigue.  ?HENT:  Negative for drooling, ear discharge, ear pain and sore throat.   ?Eyes:  Negative for blurred vision.  ?     Dry eyes  ?Respiratory:  Negative for cough, choking, shortness of breath and wheezing.   ?Cardiovascular:  Negative for chest pain, palpitations, orthopnea and leg swelling.  ?Gastrointestinal:  Negative for abdominal pain, blood in stool, constipation, diarrhea, dysphagia, heartburn and nausea.  ?Endocrine: Negative for polydipsia.  ?Genitourinary:  Negative for dysuria, frequency, hematuria and urgency.  ?Musculoskeletal:  Negative for back pain, myalgias and neck pain.  ?Skin:  Negative for rash.  ?Allergic/Immunologic: Negative for environmental allergies.  ?Neurological:  Negative for dizziness and headaches.  ?Hematological:  Does not bruise/bleed easily.  ?Psychiatric/Behavioral:  Negative for suicidal ideas. The patient is not nervous/anxious.   ? ?Patient Active Problem List  ? Diagnosis Date Noted  ? Dyslipidemia 07/24/2017  ? Routine general medical examination at a health care facility 11/08/2014  ? Benign essential HTN 11/08/2014  ?  Screening for depression 11/08/2014  ? Hypercholesteremia 11/08/2014  ? H/O arthritis 11/08/2014  ? ? ?Allergies  ?Allergen Reactions  ? Hydrochlorothiazide Rash  ? ? ?No past surgical history on file. ? ?Social History  ? ?Tobacco Use  ? Smoking status: Former  ?  Types: Cigarettes  ? Smokeless tobacco: Former  ?  Quit date:  07/01/2008  ?Vaping Use  ? Vaping Use: Never used  ?Substance Use Topics  ? Alcohol use: Yes  ?  Alcohol/week: 0.0 standard drinks  ?  Comment: occas  ? Drug use: No  ? ? ? ?Medication list has been reviewed and updated. ? ?Current Meds  ?Medication Sig  ? aspirin 81 MG tablet Take 1 tablet by mouth daily.  ? atorvastatin (LIPITOR) 20 MG tablet TAKE 1 TABLET(20 MG) BY MOUTH DAILY  ? cetirizine (ZYRTEC) 10 MG tablet Take 1 tablet (10 mg total) by mouth daily.  ? montelukast (SINGULAIR) 10 MG tablet Take 1 tablet (10 mg total) by mouth at bedtime.  ? pantoprazole (PROTONIX) 40 MG tablet Take 1 tablet (40 mg total) by mouth daily.  ? ? ?PHQ 2/9 Scores 09/18/2021 07/11/2021 03/20/2021 09/29/2020  ?PHQ - 2 Score 0 0 0 0  ?PHQ- 9 Score 0 - 0 0  ? ? ?GAD 7 : Generalized Anxiety Score 09/18/2021 03/20/2021 09/29/2020 03/31/2020  ?Nervous, Anxious, on Edge 0 0 0 0  ?Control/stop worrying 0 0 0 0  ?Worry too much - different things 0 0 0 0  ?Trouble relaxing 0 0 0 0  ?Restless 0 0 0 0  ?Easily annoyed or irritable 0 0 0 0  ?Afraid - awful might happen 0 0 0 0  ?Total GAD 7 Score 0 0 0 0  ?Anxiety Difficulty Not difficult at all - - -  ? ? ?BP Readings from Last 3 Encounters:  ?09/18/21 (!) 142/84  ?08/16/21 (!) 144/80  ?03/20/21 130/80  ? ? ?Physical Exam ?Vitals and nursing note reviewed.  ?HENT:  ?   Head: Normocephalic.  ?   Right Ear: Tympanic membrane, ear canal and external ear normal. There is no impacted cerumen.  ?   Left Ear: Tympanic membrane, ear canal and external ear normal. There is no impacted cerumen.  ?   Nose: Nose normal. No congestion or rhinorrhea.  ?Eyes:  ?   General: No scleral icterus.    ?   Right eye: No discharge.     ?   Left eye: No discharge.  ?   Conjunctiva/sclera: Conjunctivae normal.  ?   Pupils: Pupils are equal, round, and reactive to light.  ?Neck:  ?   Thyroid: No thyromegaly.  ?   Vascular: No JVD.  ?   Trachea: No tracheal deviation.  ?Cardiovascular:  ?   Rate and Rhythm: Normal rate and regular  rhythm.  ?   Heart sounds: Normal heart sounds. No murmur heard. ?  No friction rub. No gallop.  ?Pulmonary:  ?   Effort: No respiratory distress.  ?   Breath sounds: Normal breath sounds. No wheezing, rhonchi or rales.  ?Abdominal:  ?   General: Bowel sounds are normal.  ?   Palpations: Abdomen is soft. There is no mass.  ?   Tenderness: There is no abdominal tenderness. There is no guarding or rebound.  ?Musculoskeletal:     ?   General: No tenderness. Normal range of motion.  ?   Cervical back: Normal range of motion and neck supple.  ?Lymphadenopathy:  ?  Cervical: No cervical adenopathy.  ?Skin: ?   General: Skin is warm.  ?   Findings: No rash.  ?Neurological:  ?   Mental Status: He is alert and oriented to person, place, and time.  ?   Cranial Nerves: No cranial nerve deficit.  ?   Deep Tendon Reflexes: Reflexes are normal and symmetric.  ? ? ?Wt Readings from Last 3 Encounters:  ?09/18/21 183 lb (83 kg)  ?08/16/21 181 lb (82.1 kg)  ?03/20/21 175 lb (79.4 kg)  ? ? ?BP (!) 142/84   Pulse 72   Ht 5' 10" (1.778 m)   Wt 183 lb (83 kg)   BMI 26.26 kg/m?  ? ?Assessment and Plan: ? ?1. Benign essential HTN ?Chronic.  Uncontrolled.  Stable.  Since having to discontinue hydrochlorothiazide due to hives on her previous visit patient returns for recheck and blood pressure is elevated at 142/84.  We will initiate amlodipine 2.5 mg once a day.  We will recheck in 6 weeks. ?- amLODipine (NORVASC) 2.5 MG tablet; Take 1 tablet (2.5 mg total) by mouth daily.  Dispense: 90 tablet; Refill: 1 ? ?2. Dyslipidemia ?Chronic.  Controlled.  Stable.  Continue atorvastatin 20 mg once a day.  We will recheck in 6 months. ?- atorvastatin (LIPITOR) 20 MG tablet; TAKE 1 TABLET(20 MG) BY MOUTH DAILY  Dispense: 90 tablet; Refill: 1 ? ?3. Gastroesophageal reflux disease, unspecified whether esophagitis present ?Chronic.  Controlled.  Stable.  Tolerating pantoprazole 40 mg once a day.  Patient can eat just about anything he wants to eat as  long as he is taking medication.  We will recheck in 6 months. ?- pantoprazole (PROTONIX) 40 MG tablet; Take 1 tablet (40 mg total) by mouth daily.  Dispense: 90 tablet; Refill: 1  ? ?

## 2021-09-24 ENCOUNTER — Encounter: Payer: Self-pay | Admitting: Family Medicine

## 2021-09-24 ENCOUNTER — Ambulatory Visit (INDEPENDENT_AMBULATORY_CARE_PROVIDER_SITE_OTHER): Payer: Medicare Other | Admitting: Family Medicine

## 2021-09-24 ENCOUNTER — Other Ambulatory Visit: Payer: Self-pay

## 2021-09-24 VITALS — BP 140/80 | HR 80 | Ht 70.0 in | Wt 183.0 lb

## 2021-09-24 DIAGNOSIS — I10 Essential (primary) hypertension: Secondary | ICD-10-CM

## 2021-09-24 DIAGNOSIS — B354 Tinea corporis: Secondary | ICD-10-CM | POA: Diagnosis not present

## 2021-09-24 MED ORDER — CLOTRIMAZOLE-BETAMETHASONE 1-0.05 % EX CREA: 1.0000 "application " | TOPICAL_CREAM | Freq: Every day | CUTANEOUS | 2 refills | Status: AC

## 2021-09-24 NOTE — Progress Notes (Signed)
? ? ?Date:  09/24/2021  ? ?Name:  Kenneth Velasquez   DOB:  03-02-1956   MRN:  [id number] ? ? ?Chief Complaint: Rash ? ?Rash ?This is a new problem. The current episode started today. The affected locations include the neck (inguinal bilateral). The rash is characterized by redness. (non2)  ? ?Lab Results  ?Component Value Date  ? NA 139 03/20/2021  ? K 4.3 03/20/2021  ? CO2 25 03/20/2021  ? GLUCOSE 97 03/20/2021  ? BUN 15 03/20/2021  ? CREATININE 1.07 03/20/2021  ? CALCIUM 10.0 03/20/2021  ? EGFR 77 03/20/2021  ? GFRNONAA 74 03/31/2020  ? ?Lab Results  ?Component Value Date  ? CHOL 201 (H) 03/20/2021  ? HDL 44 03/20/2021  ? LDLCALC 121 (H) 03/20/2021  ? TRIG 205 (H) 03/20/2021  ? CHOLHDL 5.6 (H) 07/24/2017  ? ?No results found for: TSH ?No results found for: HGBA1C ?Lab Results  ?Component Value Date  ? WBC 6.6 01/06/2015  ? HGB 14.9 01/06/2015  ? HCT 43.7 01/06/2015  ? MCV 90.8 01/06/2015  ? PLT 255 01/06/2015  ? ?Lab Results  ?Component Value Date  ? ALT 32 03/20/2021  ? AST 30 03/20/2021  ? ALKPHOS 89 03/20/2021  ? BILITOT 0.4 03/20/2021  ? ?No results found for: 25OHVITD2, [place], VD25OH  ? ?Review of Systems  ?Skin:  Positive for rash.  ? ?Patient Active Problem List  ? Diagnosis Date Noted  ? Dyslipidemia 07/24/2017  ? Routine general medical examination at a health care facility 11/08/2014  ? Benign essential HTN 11/08/2014  ? Screening for depression 11/08/2014  ? Hypercholesteremia 11/08/2014  ? H/O arthritis 11/08/2014  ? ? ?Allergies  ?Allergen Reactions  ? Hydrochlorothiazide Rash  ? ? ?No past surgical history on file. ? ?Social History  ? ?Tobacco Use  ? Smoking status: Former  ?  Types: Cigarettes  ? Smokeless tobacco: Former  ?  Quit date: 07/01/2008  ?Vaping Use  ? Vaping Use: Never used  ?Substance Use Topics  ? Alcohol use: Yes  ?  Alcohol/week: 0.0 standard drinks  ?  Comment: occas  ? Drug use: No  ? ? ? ?Medication list has been reviewed and updated. ? ?Current Meds  ?Medication Sig  ?  amLODipine (NORVASC) 2.5 MG tablet Take 1 tablet (2.5 mg total) by mouth daily.  ? aspirin 81 MG tablet Take 1 tablet by mouth daily.  ? atorvastatin (LIPITOR) 20 MG tablet TAKE 1 TABLET(20 MG) BY MOUTH DAILY  ? cetirizine (ZYRTEC) 10 MG tablet Take 1 tablet (10 mg total) by mouth daily.  ? fluticasone (FLONASE) 50 MCG/ACT nasal spray Place 2 sprays into both nostrils daily.  ? montelukast (SINGULAIR) 10 MG tablet Take 1 tablet (10 mg total) by mouth at bedtime.  ? pantoprazole (PROTONIX) 40 MG tablet Take 1 tablet (40 mg total) by mouth daily.  ? ? ? ?  09/18/2021  ?  7:59 AM 07/11/2021  ?  9:45 AM 03/20/2021  ?  7:54 AM 09/29/2020  ?  8:10 AM  ?PHQ 2/9 Scores  ?PHQ - 2 Score 0 0 0 0  ?PHQ- 9 Score 0  0 0  ? ? ? ?  09/18/2021  ?  7:59 AM 03/20/2021  ?  7:54 AM 09/29/2020  ?  8:11 AM 03/31/2020  ?  8:19 AM  ?GAD 7 : Generalized Anxiety Score  ?Nervous, Anxious, on Edge 0 0 0 0  ?Control/stop worrying 0 0 0 0  ?Worry too much - different  things 0 0 0 0  ?Trouble relaxing 0 0 0 0  ?Restless 0 0 0 0  ?Easily annoyed or irritable 0 0 0 0  ?Afraid - awful might happen 0 0 0 0  ?Total GAD 7 Score 0 0 0 0  ?Anxiety Difficulty Not difficult at all     ? ? ?BP Readings from Last 3 Encounters:  ?09/24/21 140/80  ?09/18/21 (!) 142/84  ?08/16/21 (!) 144/80  ? ? ?Physical Exam ? ?Wt Readings from Last 3 Encounters:  ?09/24/21 183 lb (83 kg)  ?09/18/21 183 lb (83 kg)  ?08/16/21 181 lb (82.1 kg)  ? ? ?BP 140/80   Pulse 80   Ht 5' 10"  (1.778 m)   Wt 183 lb (83 kg)   BMI 26.26 kg/m?  ? ?Assessment and Plan: ? ?1. Tinea corporis ?New onset.  Recurrence.  Stable.  This is erythema in the folds of the skin of the neck and inguinal area which is more of a razor burn like distribution.  Began to think this is not a drug type reaction and that is more of a tinea or irritant dermatitis.  We will treat with Lotrisone for the antifungal and the end time inflammatory nature with the steroid.  And will recheck when he is to come back for another  blood pressure check. ?- clotrimazole-betamethasone (LOTRISONE) cream; Apply 1 application. topically daily.  Dispense: 30 g; Refill: 2 ? ?2. Benign essential HTN ?Chronic.  Relatively controlled.  Stable.  Blood pressure was only 142 on the last reading.  We will stop his amlodipine which I do not think to cause any kind of a rash and have him come back for a blood pressure on no medication and be reluctant to start him at a low dose because of the nature of the this rash that comes and goes which I do not think is a drug reaction rash. ? ? ?

## 2021-10-30 ENCOUNTER — Encounter: Payer: Self-pay | Admitting: Family Medicine

## 2021-10-30 ENCOUNTER — Ambulatory Visit (INDEPENDENT_AMBULATORY_CARE_PROVIDER_SITE_OTHER): Payer: Medicare Other | Admitting: Family Medicine

## 2021-10-30 VITALS — BP 136/94 | HR 90 | Ht 70.0 in | Wt 182.0 lb

## 2021-10-30 DIAGNOSIS — I10 Essential (primary) hypertension: Secondary | ICD-10-CM | POA: Diagnosis not present

## 2021-10-30 DIAGNOSIS — E785 Hyperlipidemia, unspecified: Secondary | ICD-10-CM

## 2021-10-30 MED ORDER — AMLODIPINE BESYLATE 5 MG PO TABS: 5.0000 mg | ORAL_TABLET | Freq: Every day | ORAL | 0 refills | Status: DC

## 2021-10-30 MED ORDER — ATORVASTATIN CALCIUM 20 MG PO TABS: ORAL_TABLET | ORAL | 1 refills | Status: DC

## 2021-10-30 NOTE — Progress Notes (Signed)
? ? ?Date:  10/30/2021  ? ?Name:  Kenneth Velasquez   DOB:  1956-02-09   MRN:  [id number] ? ? ?Chief Complaint: Follow-up (Rash cleared up with clotrim. cream) and Hyperlipidemia ? ?Hyperlipidemia ?This is a chronic problem. The current episode started more than 1 year ago. The problem is controlled. Recent lipid tests were reviewed and are normal. He has no history of chronic renal disease, diabetes, hypothyroidism, liver disease, obesity or nephrotic syndrome. There are no known factors aggravating his hyperlipidemia. Pertinent negatives include no chest pain, focal sensory loss, focal weakness, leg pain, myalgias or shortness of breath. Current antihyperlipidemic treatment includes statins. The current treatment provides significant improvement of lipids. There are no compliance problems.  Risk factors for coronary artery disease include dyslipidemia and hypertension.  ?Hypertension ?This is a chronic problem. The current episode started more than 1 year ago. The problem has been gradually improving since onset. The problem is controlled. Pertinent negatives include no chest pain, headaches, neck pain, palpitations or shortness of breath. The current treatment provides moderate improvement. There are no compliance problems.  There is no history of chronic renal disease.  ? ?Lab Results  ?Component Value Date  ? NA 139 03/20/2021  ? K 4.3 03/20/2021  ? CO2 25 03/20/2021  ? GLUCOSE 97 03/20/2021  ? BUN 15 03/20/2021  ? CREATININE 1.07 03/20/2021  ? CALCIUM 10.0 03/20/2021  ? EGFR 77 03/20/2021  ? GFRNONAA 74 03/31/2020  ? ?Lab Results  ?Component Value Date  ? CHOL 201 (H) 03/20/2021  ? HDL 44 03/20/2021  ? LDLCALC 121 (H) 03/20/2021  ? TRIG 205 (H) 03/20/2021  ? CHOLHDL 5.6 (H) 07/24/2017  ? ?No results found for: TSH ?No results found for: HGBA1C ?Lab Results  ?Component Value Date  ? WBC 6.6 01/06/2015  ? HGB 14.9 01/06/2015  ? HCT 43.7 01/06/2015  ? MCV 90.8 01/06/2015  ? PLT 255 01/06/2015  ? ?Lab Results   ?Component Value Date  ? ALT 32 03/20/2021  ? AST 30 03/20/2021  ? ALKPHOS 89 03/20/2021  ? BILITOT 0.4 03/20/2021  ? ?No results found for: 25OHVITD2, [place], VD25OH  ? ?Review of Systems  ?Constitutional:  Negative for chills and fever.  ?HENT:  Negative for drooling, ear discharge, ear pain and sore throat.   ?Respiratory:  Negative for cough, shortness of breath and wheezing.   ?Cardiovascular:  Negative for chest pain, palpitations and leg swelling.  ?Gastrointestinal:  Negative for abdominal pain, blood in stool, constipation, diarrhea and nausea.  ?Endocrine: Negative for polydipsia.  ?Genitourinary:  Negative for dysuria, frequency, hematuria and urgency.  ?Musculoskeletal:  Negative for back pain, myalgias and neck pain.  ?Skin:  Negative for rash.  ?Allergic/Immunologic: Negative for environmental allergies.  ?Neurological:  Negative for dizziness, focal weakness and headaches.  ?Hematological:  Does not bruise/bleed easily.  ?Psychiatric/Behavioral:  Negative for suicidal ideas. The patient is not nervous/anxious.   ? ?Patient Active Problem List  ? Diagnosis Date Noted  ? Dyslipidemia 07/24/2017  ? Routine general medical examination at a health care facility 11/08/2014  ? Benign essential HTN 11/08/2014  ? Screening for depression 11/08/2014  ? Hypercholesteremia 11/08/2014  ? H/O arthritis 11/08/2014  ? ? ?Allergies  ?Allergen Reactions  ? Hydrochlorothiazide Rash  ? ? ?No past surgical history on file. ? ?Social History  ? ?Tobacco Use  ? Smoking status: Former  ?  Types: Cigarettes  ? Smokeless tobacco: Former  ?  Quit date: 07/01/2008  ?Vaping Use  ?  Vaping Use: Never used  ?Substance Use Topics  ? Alcohol use: Yes  ?  Alcohol/week: 0.0 standard drinks  ?  Comment: occas  ? Drug use: No  ? ? ? ?Medication list has been reviewed and updated. ? ?Current Meds  ?Medication Sig  ? amLODipine (NORVASC) 2.5 MG tablet Take 1 tablet (2.5 mg total) by mouth daily.  ? aspirin 81 MG tablet Take 1 tablet by  mouth daily.  ? atorvastatin (LIPITOR) 20 MG tablet TAKE 1 TABLET(20 MG) BY MOUTH DAILY  ? cetirizine (ZYRTEC) 10 MG tablet Take 1 tablet (10 mg total) by mouth daily.  ? clotrimazole-betamethasone (LOTRISONE) cream Apply 1 application. topically daily.  ? fluticasone (FLONASE) 50 MCG/ACT nasal spray Place 2 sprays into both nostrils daily.  ? montelukast (SINGULAIR) 10 MG tablet Take 1 tablet (10 mg total) by mouth at bedtime.  ? pantoprazole (PROTONIX) 40 MG tablet Take 1 tablet (40 mg total) by mouth daily.  ? ? ? ?  10/30/2021  ?  8:11 AM 09/18/2021  ?  7:59 AM 03/20/2021  ?  7:54 AM 09/29/2020  ?  8:11 AM  ?GAD 7 : Generalized Anxiety Score  ?Nervous, Anxious, on Edge 0 0 0 0  ?Control/stop worrying 0 0 0 0  ?Worry too much - different things 0 0 0 0  ?Trouble relaxing 0 0 0 0  ?Restless 0 0 0 0  ?Easily annoyed or irritable 0 0 0 0  ?Afraid - awful might happen 0 0 0 0  ?Total GAD 7 Score 0 0 0 0  ?Anxiety Difficulty Not difficult at all Not difficult at all    ? ? ? ?  10/30/2021  ?  8:10 AM  ?Depression screen PHQ 2/9  ?Decreased Interest 0  ?Down, Depressed, Hopeless 0  ?PHQ - 2 Score 0  ?Altered sleeping 0  ?Tired, decreased energy 0  ?Change in appetite 0  ?Feeling bad or failure about yourself  0  ?Trouble concentrating 0  ?Moving slowly or fidgety/restless 0  ?Suicidal thoughts 0  ?PHQ-9 Score 0  ?Difficult doing work/chores Not difficult at all  ? ? ?BP Readings from Last 3 Encounters:  ?10/30/21 (!) 150/90  ?09/24/21 140/80  ?09/18/21 (!) 142/84  ? ? ?Physical Exam ?Vitals and nursing note reviewed.  ?HENT:  ?   Head: Normocephalic.  ?   Right Ear: Tympanic membrane and external ear normal.  ?   Left Ear: Tympanic membrane and external ear normal.  ?   Nose: Nose normal.  ?Eyes:  ?   General: No scleral icterus.    ?   Right eye: No discharge.     ?   Left eye: No discharge.  ?   Conjunctiva/sclera: Conjunctivae normal.  ?   Pupils: Pupils are equal, round, and reactive to light.  ?Neck:  ?   Thyroid: No  thyromegaly.  ?   Vascular: No JVD.  ?   Trachea: No tracheal deviation.  ?Cardiovascular:  ?   Rate and Rhythm: Normal rate and regular rhythm.  ?   Heart sounds: Normal heart sounds. No murmur heard. ?  No friction rub. No gallop.  ?Pulmonary:  ?   Effort: No respiratory distress.  ?   Breath sounds: Normal breath sounds. No wheezing, rhonchi or rales.  ?Abdominal:  ?   General: Bowel sounds are normal.  ?   Palpations: Abdomen is soft. There is no mass.  ?   Tenderness: There is no abdominal tenderness. There is  no guarding or rebound.  ?Musculoskeletal:     ?   General: No tenderness. Normal range of motion.  ?   Cervical back: Normal range of motion and neck supple.  ?Lymphadenopathy:  ?   Cervical: No cervical adenopathy.  ?Skin: ?   General: Skin is warm.  ?   Findings: No rash.  ?Neurological:  ?   Mental Status: He is alert and oriented to person, place, and time.  ?   Cranial Nerves: No cranial nerve deficit.  ?   Deep Tendon Reflexes: Reflexes are normal and symmetric.  ? ? ?Wt Readings from Last 3 Encounters:  ?10/30/21 182 lb (82.6 kg)  ?09/24/21 183 lb (83 kg)  ?09/18/21 183 lb (83 kg)  ? ? ?BP (!) 150/90   Pulse 90   Ht _0  (1.778 m)   Wt 182 lb (82.6 kg)   BMI 26.11 kg/m?  ? ?Assessment and Plan: ?1. Benign essential HTN ?Chronic.  Uncontrolled.  Stable.  Blood pressure on recheck 136/94.  We will increase amlodipine to 5 mg once a day.  We will recheck in 6 weeks.  Patient's also been encouraged to decrease sodium intake as well. ?- amLODipine (NORVASC) 5 MG tablet; Take 1 tablet (5 mg total) by mouth daily.  Dispense: 90 tablet; Refill: 0 ? ?2. Dyslipidemia ?Chronic.  Controlled.  Stable.  Continue atorvastatin 20 mg once a day.  Will check LDL for current status of control. ?- Lipid Panel With LDL/HDL Ratio ?- atorvastatin (LIPITOR) 20 MG tablet; TAKE 1 TABLET(20 MG) BY MOUTH DAILY  Dispense: 90 tablet; Refill: 1  ? ? ? ?

## 2021-10-31 LAB — LIPID PANEL WITH LDL/HDL RATIO
Cholesterol, Total: 190 mg/dL (ref 100–199)
HDL: 44 mg/dL (ref >39)
LDL Chol Calc (NIH): 115 mg/dL — ABNORMAL HIGH (ref 0–99)
LDL/HDL Ratio: 2.6 ratio (ref 0.0–3.6)
Triglycerides: 174 mg/dL — ABNORMAL HIGH (ref 0–149)
VLDL Cholesterol Cal: 31 mg/dL (ref 5–40)

## 2021-12-11 ENCOUNTER — Ambulatory Visit (INDEPENDENT_AMBULATORY_CARE_PROVIDER_SITE_OTHER): Payer: Medicare Other | Admitting: Family Medicine

## 2021-12-11 ENCOUNTER — Encounter: Payer: Self-pay | Admitting: Family Medicine

## 2021-12-11 VITALS — BP 138/80 | HR 80 | Ht 70.0 in | Wt 178.0 lb

## 2021-12-11 DIAGNOSIS — I10 Essential (primary) hypertension: Secondary | ICD-10-CM

## 2021-12-11 DIAGNOSIS — E785 Hyperlipidemia, unspecified: Secondary | ICD-10-CM

## 2021-12-11 MED ORDER — ATORVASTATIN CALCIUM 20 MG PO TABS: ORAL_TABLET | ORAL | 1 refills | Status: DC

## 2021-12-11 MED ORDER — AMLODIPINE BESYLATE 5 MG PO TABS: 5.0000 mg | ORAL_TABLET | Freq: Every day | ORAL | 1 refills | Status: DC

## 2021-12-11 NOTE — Progress Notes (Signed)
Date:  12/11/2021   Name:  Kenneth Velasquez   DOB:  1956-04-11   MRN:  [id number]   Chief Complaint: Hypertension (Increased Amlodipine)  Hypertension This is a chronic problem. The current episode started more than 1 year ago. The problem has been gradually improving since onset. The problem is controlled. Pertinent negatives include no anxiety, blurred vision, chest pain, headaches, malaise/fatigue, neck pain, orthopnea, palpitations, peripheral edema, PND, shortness of breath or sweats. Risk factors for coronary artery disease include dyslipidemia. Past treatments include calcium channel blockers. The current treatment provides moderate improvement. There are no compliance problems.  There is no history of angina, kidney disease, CAD/MI, CVA, heart failure, left ventricular hypertrophy, PVD or retinopathy. There is no history of chronic renal disease, a hypertension causing med or renovascular disease.    Lab Results  Component Value Date   NA 139 03/20/2021   K 4.3 03/20/2021   CO2 25 03/20/2021   GLUCOSE 97 03/20/2021   BUN 15 03/20/2021   CREATININE 1.07 03/20/2021   CALCIUM 10.0 03/20/2021   EGFR 77 03/20/2021   GFRNONAA 74 03/31/2020   Lab Results  Component Value Date   CHOL 190 10/30/2021   HDL 44 10/30/2021   LDLCALC 115 (H) 10/30/2021   TRIG 174 (H) 10/30/2021   CHOLHDL 5.6 (H) 07/24/2017   No results found for: "TSH" No results found for: "HGBA1C" Lab Results  Component Value Date   WBC 6.6 01/06/2015   HGB 14.9 01/06/2015   HCT 43.7 01/06/2015   MCV 90.8 01/06/2015   PLT 255 01/06/2015   Lab Results  Component Value Date   ALT 32 03/20/2021   AST 30 03/20/2021   ALKPHOS 89 03/20/2021   BILITOT 0.4 03/20/2021   No results found for: "25OHVITD2", "25OHVITD3", "VD25OH"   Review of Systems  Constitutional:  Negative for fatigue, malaise/fatigue and unexpected weight change.  Eyes:  Negative for blurred vision.  Respiratory:  Negative for shortness of  breath and wheezing.   Cardiovascular:  Negative for chest pain, palpitations, orthopnea, leg swelling and PND.  Gastrointestinal:  Negative for abdominal distention.  Endocrine: Negative for polydipsia and polyuria.  Musculoskeletal:  Negative for neck pain.  Neurological:  Negative for headaches.    Patient Active Problem List   Diagnosis Date Noted   Dyslipidemia 07/24/2017   Routine general medical examination at a health care facility 11/08/2014   Benign essential HTN 11/08/2014   Screening for depression 11/08/2014   Hypercholesteremia 11/08/2014   H/O arthritis 11/08/2014    Allergies  Allergen Reactions   Hydrochlorothiazide Rash    No past surgical history on file.  Social History   Tobacco Use   Smoking status: Former    Types: Cigarettes   Smokeless tobacco: Former    Quit date: 07/01/2008  Vaping Use   Vaping Use: Never used  Substance Use Topics   Alcohol use: Yes    Alcohol/week: 0.0 standard drinks of alcohol    Comment: occas   Drug use: No     Medication list has been reviewed and updated.  Current Meds  Medication Sig   amLODipine (NORVASC) 5 MG tablet Take 1 tablet (5 mg total) by mouth daily.   aspirin 81 MG tablet Take 1 tablet by mouth daily.   atorvastatin (LIPITOR) 20 MG tablet TAKE 1 TABLET(20 MG) BY MOUTH DAILY   cetirizine (ZYRTEC) 10 MG tablet Take 1 tablet (10 mg total) by mouth daily.   clotrimazole-betamethasone (LOTRISONE) cream Apply 1  application. topically daily.   fluticasone (FLONASE) 50 MCG/ACT nasal spray Place 2 sprays into both nostrils daily.   montelukast (SINGULAIR) 10 MG tablet Take 1 tablet (10 mg total) by mouth at bedtime.   pantoprazole (PROTONIX) 40 MG tablet Take 1 tablet (40 mg total) by mouth daily.       12/11/2021    9:22 AM 10/30/2021    8:11 AM 09/18/2021    7:59 AM 03/20/2021    7:54 AM  GAD 7 : Generalized Anxiety Score  Nervous, Anxious, on Edge 1 0 0 0  Control/stop worrying 0 0 0 0  Worry too much  - different things 0 0 0 0  Trouble relaxing 0 0 0 0  Restless 2 0 0 0  Easily annoyed or irritable 0 0 0 0  Afraid - awful might happen 0 0 0 0  Total GAD 7 Score 3 0 0 0  Anxiety Difficulty Not difficult at all Not difficult at all Not difficult at all        12/11/2021    9:22 AM  Depression screen PHQ 2/9  Decreased Interest 0  Down, Depressed, Hopeless 1  PHQ - 2 Score 1  Altered sleeping 1  Tired, decreased energy 0  Change in appetite 0  Feeling bad or failure about yourself  0  Trouble concentrating 1  Moving slowly or fidgety/restless 1  Suicidal thoughts 0  PHQ-9 Score 4  Difficult doing work/chores Not difficult at all    BP Readings from Last 3 Encounters:  12/11/21 138/80  10/30/21 (!) 136/94  09/24/21 140/80    Physical Exam Vitals and nursing note reviewed.  HENT:     Head: Normocephalic.     Right Ear: Tympanic membrane, ear canal and external ear normal.     Left Ear: Tympanic membrane, ear canal and external ear normal.     Nose: Nose normal. No congestion or rhinorrhea.  Eyes:     General: No scleral icterus.       Right eye: No discharge.        Left eye: No discharge.     Conjunctiva/sclera: Conjunctivae normal.     Pupils: Pupils are equal, round, and reactive to light.  Neck:     Thyroid: No thyromegaly.     Vascular: No carotid bruit or JVD.     Trachea: No tracheal deviation.  Cardiovascular:     Rate and Rhythm: Normal rate and regular rhythm.     Heart sounds: Normal heart sounds. No murmur heard.    No friction rub. No gallop.  Pulmonary:     Effort: No respiratory distress.     Breath sounds: Normal breath sounds. No wheezing, rhonchi or rales.  Chest:     Chest wall: No tenderness.  Abdominal:     General: Bowel sounds are normal.     Palpations: Abdomen is soft. There is no mass.     Tenderness: There is no abdominal tenderness. There is no guarding or rebound.  Musculoskeletal:        General: No tenderness. Normal range  of motion.     Cervical back: Normal range of motion and neck supple.  Lymphadenopathy:     Cervical: No cervical adenopathy.  Skin:    General: Skin is warm.     Findings: No rash.  Neurological:     Mental Status: He is alert and oriented to person, place, and time.     Cranial Nerves: No cranial nerve deficit.  Deep Tendon Reflexes: Reflexes are normal and symmetric.     Wt Readings from Last 3 Encounters:  12/11/21 178 lb (80.7 kg)  10/30/21 182 lb (82.6 kg)  09/24/21 183 lb (83 kg)    BP 138/80   Pulse 80   Ht 5' 10"  (1.778 m)   Wt 178 lb (80.7 kg)   BMI 25.54 kg/m   Assessment and Plan:  1. Benign essential HTN Chronic.  Controlled.  Stable.  Blood pressure 138/80.  Continue amlodipine 5 mg once a day.  Review of previous renal function panel is acceptable we will recheck patient in 6 months. - amLODipine (NORVASC) 5 MG tablet; Take 1 tablet (5 mg total) by mouth daily.  Dispense: 90 tablet; Refill: 1  2. Dyslipidemia Chronic.  Controlled.  Stable.  Last checked in May and LDLs in acceptable range as his triglycerides.  Reemphasized diet and we will continue with atorvastatin 20 mg once a day.  We will recheck patient in 6 months. - atorvastatin (LIPITOR) 20 MG tablet; TAKE 1 TABLET(20 MG) BY MOUTH DAILY  Dispense: 90 tablet; Refill: 1

## 2022-01-28 ENCOUNTER — Other Ambulatory Visit: Payer: Self-pay | Admitting: Family Medicine

## 2022-01-28 DIAGNOSIS — I10 Essential (primary) hypertension: Secondary | ICD-10-CM

## 2022-01-28 NOTE — Telephone Encounter (Unsigned)
Copied from CRM [id number]. Topic: General - Other >> Jan 28, 2022  8:58 AM Everette C wrote: Reason for CRM: Medication Refill - Medication: amLODipine (NORVASC) 5 MG tablet [[id number]]   Has the patient contacted their pharmacy? Yes.  The patient was directed to contact their PCP  (Agent: If no, request that the patient contact the pharmacy for the refill. If patient does not wish to contact the pharmacy document the reason why and proceed with request.) (Agent: If yes, when and what did the pharmacy advise?)  Preferred Pharmacy (with phone number or street name): [id number], [place] - [address] AT [facility] OF [address] [place] [address] Phone: [phone] Fax: [phone] Hours: Not open 24 hours   Has the patient been seen for an appointment in the last year OR does the patient have an upcoming appointment? Yes.    Agent: Please be advised that RX refills may take up to 3 business days. We ask that you follow-up with your pharmacy.

## 2022-01-29 NOTE — Telephone Encounter (Signed)
Rx 12/11/21 #90 1RF- too soon Requested Prescriptions  Pending Prescriptions Disp Refills  . amLODipine (NORVASC) 5 MG tablet 90 tablet 1    Sig: Take 1 tablet (5 mg total) by mouth daily.     Cardiovascular: Calcium Channel Blockers 2 Passed - 01/28/2022 10:12 AM      Passed - Last BP in normal range    BP Readings from Last 1 Encounters:  12/11/21 138/80         Passed - Last Heart Rate in normal range    Pulse Readings from Last 1 Encounters:  12/11/21 80         Passed - Valid encounter within last 6 months    Recent Outpatient Visits          1 month ago Benign essential HTN   [facility] Duanne Limerick, MD   3 months ago Benign essential HTN   [facility] Duanne Limerick, MD   4 months ago Tinea corporis   [facility] Duanne Limerick, MD   4 months ago Benign essential HTN   [facility] Duanne Limerick, MD   5 months ago Allergic reaction to drug, initial encounter   [facility] Duanne Limerick, MD      Future Appointments            In 1 month Duanne Limerick, MD [facility], [facility]

## 2022-03-21 ENCOUNTER — Encounter: Payer: Self-pay | Admitting: Family Medicine

## 2022-03-21 ENCOUNTER — Ambulatory Visit (INDEPENDENT_AMBULATORY_CARE_PROVIDER_SITE_OTHER): Payer: Medicare Other | Admitting: Family Medicine

## 2022-03-21 VITALS — BP 130/88 | HR 98 | Ht 70.0 in | Wt 183.0 lb

## 2022-03-21 DIAGNOSIS — E785 Hyperlipidemia, unspecified: Secondary | ICD-10-CM | POA: Diagnosis not present

## 2022-03-21 DIAGNOSIS — K219 Gastro-esophageal reflux disease without esophagitis: Secondary | ICD-10-CM | POA: Diagnosis not present

## 2022-03-21 DIAGNOSIS — Z23 Encounter for immunization: Secondary | ICD-10-CM

## 2022-03-21 DIAGNOSIS — I10 Essential (primary) hypertension: Secondary | ICD-10-CM

## 2022-03-21 MED ORDER — AMLODIPINE BESYLATE 5 MG PO TABS: 5.0000 mg | ORAL_TABLET | Freq: Every day | ORAL | 1 refills | Status: DC

## 2022-03-21 MED ORDER — PANTOPRAZOLE SODIUM 40 MG PO TBEC: 40.0000 mg | DELAYED_RELEASE_TABLET | Freq: Every day | ORAL | 1 refills | Status: DC

## 2022-03-21 MED ORDER — ATORVASTATIN CALCIUM 20 MG PO TABS: ORAL_TABLET | ORAL | 1 refills | Status: DC

## 2022-03-21 NOTE — Progress Notes (Signed)
Date:  03/21/2022   Name:  Kenneth Velasquez   DOB:  Nov 16, 1955   MRN:  [id number]   Chief Complaint: Hypertension, Gastroesophageal Reflux, Hyperlipidemia, and Flu Vaccine  Hypertension This is a chronic problem. The current episode started more than 1 year ago. The problem has been gradually improving since onset. The problem is controlled. Pertinent negatives include no anxiety, blurred vision, chest pain, headaches, malaise/fatigue, neck pain, orthopnea, palpitations, peripheral edema, PND, shortness of breath or sweats. There are no associated agents to hypertension. There are no known risk factors for coronary artery disease. Past treatments include calcium channel blockers. The current treatment provides moderate improvement. There are no compliance problems.  There is no history of angina, kidney disease, CAD/MI, CVA, heart failure, left ventricular hypertrophy, PVD or retinopathy. There is no history of chronic renal disease, a hypertension causing med or renovascular disease.  Gastroesophageal Reflux He reports no abdominal pain, no belching, no chest pain, no choking, no coughing, no dysphagia, no early satiety, no globus sensation, no heartburn, no nausea, no sore throat, no stridor or no wheezing. This is a chronic problem. The current episode started more than 1 year ago. The problem occurs occasionally. The problem has been gradually improving. The symptoms are aggravated by certain foods. There are no known risk factors. He has tried a PPI for the symptoms. The treatment provided moderate relief.  Hyperlipidemia This is a chronic problem. The current episode started more than 1 year ago. The problem is controlled. Recent lipid tests were reviewed and are normal. He has no history of chronic renal disease, hypothyroidism or obesity. There are no known factors aggravating his hyperlipidemia. Pertinent negatives include no chest pain, myalgias or shortness of breath. Current  antihyperlipidemic treatment includes statins. The current treatment provides moderate improvement of lipids. There are no compliance problems.     Lab Results  Component Value Date   NA 139 03/20/2021   K 4.3 03/20/2021   CO2 25 03/20/2021   GLUCOSE 97 03/20/2021   BUN 15 03/20/2021   CREATININE 1.07 03/20/2021   CALCIUM 10.0 03/20/2021   EGFR 77 03/20/2021   GFRNONAA 74 03/31/2020   Lab Results  Component Value Date   CHOL 190 10/30/2021   HDL 44 10/30/2021   LDLCALC 115 (H) 10/30/2021   TRIG 174 (H) 10/30/2021   CHOLHDL 5.6 (H) 07/24/2017   No results found for: "TSH" No results found for: "HGBA1C" Lab Results  Component Value Date   WBC 6.6 01/06/2015   HGB 14.9 01/06/2015   HCT 43.7 01/06/2015   MCV 90.8 01/06/2015   PLT 255 01/06/2015   Lab Results  Component Value Date   ALT 32 03/20/2021   AST 30 03/20/2021   ALKPHOS 89 03/20/2021   BILITOT 0.4 03/20/2021   No results found for: "25OHVITD2", "25OHVITD3", "VD25OH"   Review of Systems  Constitutional:  Negative for chills, fever and malaise/fatigue.  HENT:  Negative for drooling, ear discharge, ear pain and sore throat.   Eyes:  Negative for blurred vision.  Respiratory:  Negative for cough, choking, shortness of breath and wheezing.   Cardiovascular:  Negative for chest pain, palpitations, orthopnea, leg swelling and PND.  Gastrointestinal:  Negative for abdominal pain, blood in stool, constipation, diarrhea, dysphagia, heartburn and nausea.  Endocrine: Negative for polydipsia.  Genitourinary:  Negative for dysuria, frequency, hematuria and urgency.  Musculoskeletal:  Negative for back pain, myalgias and neck pain.  Skin:  Negative for rash.  Allergic/Immunologic: Negative for  environmental allergies.  Neurological:  Negative for dizziness and headaches.  Hematological:  Does not bruise/bleed easily.  Psychiatric/Behavioral:  Negative for suicidal ideas. The patient is not nervous/anxious.     Patient  Active Problem List   Diagnosis Date Noted   Dyslipidemia 07/24/2017   Routine general medical examination at a health care facility 11/08/2014   Benign essential HTN 11/08/2014   Screening for depression 11/08/2014   Hypercholesteremia 11/08/2014   H/O arthritis 11/08/2014    Allergies  Allergen Reactions   Hydrochlorothiazide Rash    No past surgical history on file.  Social History   Tobacco Use   Smoking status: Former    Types: Cigarettes   Smokeless tobacco: Former    Quit date: 07/01/2008  Vaping Use   Vaping Use: Never used  Substance Use Topics   Alcohol use: Yes    Alcohol/week: 0.0 standard drinks of alcohol    Comment: occas   Drug use: No     Medication list has been reviewed and updated.  Current Meds  Medication Sig   amLODipine (NORVASC) 5 MG tablet Take 1 tablet (5 mg total) by mouth daily.   aspirin 81 MG tablet Take 1 tablet by mouth daily.   atorvastatin (LIPITOR) 20 MG tablet TAKE 1 TABLET(20 MG) BY MOUTH DAILY   cetirizine (ZYRTEC) 10 MG tablet Take 1 tablet (10 mg total) by mouth daily.   clotrimazole-betamethasone (LOTRISONE) cream Apply 1 application. topically daily.   fluticasone (FLONASE) 50 MCG/ACT nasal spray Place 2 sprays into both nostrils daily.   montelukast (SINGULAIR) 10 MG tablet Take 1 tablet (10 mg total) by mouth at bedtime.   pantoprazole (PROTONIX) 40 MG tablet Take 1 tablet (40 mg total) by mouth daily.       03/21/2022    8:14 AM 12/11/2021    9:22 AM 10/30/2021    8:11 AM 09/18/2021    7:59 AM  GAD 7 : Generalized Anxiety Score  Nervous, Anxious, on Edge 0 1 0 0  Control/stop worrying 0 0 0 0  Worry too much - different things 0 0 0 0  Trouble relaxing 0 0 0 0  Restless 0 2 0 0  Easily annoyed or irritable 0 0 0 0  Afraid - awful might happen 0 0 0 0  Total GAD 7 Score 0 3 0 0  Anxiety Difficulty Not difficult at all Not difficult at all Not difficult at all Not difficult at all       03/21/2022    8:13 AM  12/11/2021    9:22 AM 10/30/2021    8:10 AM  Depression screen PHQ 2/9  Decreased Interest 0 0 0  Down, Depressed, Hopeless 0 1 0  PHQ - 2 Score 0 1 0  Altered sleeping 0 1 0  Tired, decreased energy 0 0 0  Change in appetite 0 0 0  Feeling bad or failure about yourself  0 0 0  Trouble concentrating 0 1 0  Moving slowly or fidgety/restless 0 1 0  Suicidal thoughts 0 0 0  PHQ-9 Score 0 4 0  Difficult doing work/chores Not difficult at all Not difficult at all Not difficult at all    BP Readings from Last 3 Encounters:  03/21/22 130/88  12/11/21 138/80  10/30/21 (!) 136/94    Physical Exam Vitals and nursing note reviewed.  HENT:     Head: Normocephalic.     Right Ear: Tympanic membrane and external ear normal.     Left  Ear: Tympanic membrane and external ear normal.     Nose: Nose normal.  Eyes:     General: No scleral icterus.       Right eye: No discharge.        Left eye: No discharge.     Conjunctiva/sclera: Conjunctivae normal.     Pupils: Pupils are equal, round, and reactive to light.  Neck:     Thyroid: No thyromegaly.     Vascular: No JVD.     Trachea: No tracheal deviation.  Cardiovascular:     Rate and Rhythm: Normal rate and regular rhythm.     Heart sounds: Normal heart sounds, S1 normal and S2 normal. No murmur heard.    No systolic murmur is present.     No diastolic murmur is present.     No friction rub. No gallop. No S3 or S4 sounds.  Pulmonary:     Effort: No respiratory distress.     Breath sounds: Normal breath sounds. No wheezing or rales.  Abdominal:     General: Bowel sounds are normal.     Palpations: Abdomen is soft. There is no hepatomegaly, splenomegaly or mass.     Tenderness: There is no abdominal tenderness. There is no guarding or rebound.  Musculoskeletal:        General: No tenderness. Normal range of motion.     Cervical back: Normal range of motion and neck supple.     Right lower leg: No edema.     Left lower leg: No edema.   Lymphadenopathy:     Cervical: No cervical adenopathy.  Skin:    General: Skin is warm.     Findings: No rash.  Neurological:     Mental Status: He is alert and oriented to person, place, and time.     Cranial Nerves: No cranial nerve deficit.     Deep Tendon Reflexes: Reflexes are normal and symmetric.     Wt Readings from Last 3 Encounters:  03/21/22 183 lb (83 kg)  12/11/21 178 lb (80.7 kg)  10/30/21 182 lb (82.6 kg)    BP 130/88   Pulse 98   Ht _0  (1.778 m)   Wt 183 lb (83 kg)   BMI 26.26 kg/m   Assessment and Plan:  1. Benign essential HTN Chronic.  Controlled.  Stable.  Blood pressure today 130/88.  Asymptomatic.  Tolerating medications well.  We will continue amlodipine 5 mg once a day.  Will check CMP for electrolytes and GFR.  We will recheck and 6 months. - amLODipine (NORVASC) 5 MG tablet; Take 1 tablet (5 mg total) by mouth daily.  Dispense: 90 tablet; Refill: 1 - Comprehensive Metabolic Panel (CMET)  2. Dyslipidemia Chronic.  Controlled.  Stable.  Diet reemphasized.  Continue atorvastatin 20 mg once a day.  Review of previous lipid panel is acceptable at this time.  We will recheck in 6 months. - atorvastatin (LIPITOR) 20 MG tablet; TAKE 1 TABLET(20 MG) BY MOUTH DAILY  Dispense: 90 tablet; Refill: 1  3. Gastroesophageal reflux disease, unspecified whether esophagitis present Chronic.  Controlled.  Stable.  Tolerating pantoprazole well.  Patient is asymptomatic for reflux.  Continue current therapy and recheck in 6 months. - pantoprazole (PROTONIX) 40 MG tablet; Take 1 tablet (40 mg total) by mouth daily.  Dispense: 90 tablet; Refill: 1  4. Need for immunization against influenza Discussed and administered. - Flu Vaccine QUAD High Dose(Fluad)    Otilio Miu, MD

## 2022-03-22 LAB — COMPREHENSIVE METABOLIC PANEL
ALT: 42 IU/L (ref 0–44)
AST: 39 IU/L (ref 0–40)
Albumin/Globulin Ratio: 1.6 (ref 1.2–2.2)
Albumin: 4.5 g/dL (ref 3.9–4.9)
Alkaline Phosphatase: 90 IU/L (ref 44–121)
BUN/Creatinine Ratio: 12 (ref 10–24)
BUN: 13 mg/dL (ref 8–27)
Bilirubin Total: 0.5 mg/dL (ref 0.0–1.2)
CO2: 21 mmol/L (ref 20–29)
Calcium: 9.5 mg/dL (ref 8.6–10.2)
Chloride: 102 mmol/L (ref 96–106)
Creatinine, Ser: 1.07 mg/dL (ref 0.76–1.27)
Globulin, Total: 2.9 g/dL (ref 1.5–4.5)
Glucose: 99 mg/dL (ref 70–99)
Potassium: 4.2 mmol/L (ref 3.5–5.2)
Sodium: 140 mmol/L (ref 134–144)
Total Protein: 7.4 g/dL (ref 6.0–8.5)
eGFR: 77 mL/min/{1.73_m2} (ref >59)

## 2022-03-25 DIAGNOSIS — H4323 Crystalline deposits in vitreous body, bilateral: Secondary | ICD-10-CM | POA: Diagnosis not present

## 2022-04-25 ENCOUNTER — Ambulatory Visit (INDEPENDENT_AMBULATORY_CARE_PROVIDER_SITE_OTHER): Payer: Medicare Other

## 2022-04-25 DIAGNOSIS — Z23 Encounter for immunization: Secondary | ICD-10-CM | POA: Diagnosis not present

## 2022-07-15 ENCOUNTER — Ambulatory Visit (INDEPENDENT_AMBULATORY_CARE_PROVIDER_SITE_OTHER): Payer: Medicare Other

## 2022-07-15 VITALS — BP 126/78 | HR 88 | Temp 98.0°F | Resp 18 | Ht 68.11 in | Wt 190.0 lb

## 2022-07-15 DIAGNOSIS — Z0000 Encounter for general adult medical examination without abnormal findings: Secondary | ICD-10-CM | POA: Diagnosis not present

## 2022-07-15 NOTE — Progress Notes (Signed)
Subjective:   Kenneth Velasquez is a 67 y.o. male who presents for Medicare Annual/Subsequent preventive examination.  Review of Systems    Per HPI unless specifically indicated below.  Cardiac Risk Factors include: advanced age (>70men, >35 women);male gender          Objective:    Today's Vitals   07/15/22 1121 07/15/22 1131  BP: 126/78   Pulse: 88   Resp: 18   Temp: 98 F (36.7 C)   TempSrc: Oral   SpO2: 95%   Weight: 190 lb (86.2 kg)   Height: 5' 8.11" (1.73 m)   PainSc: 0-No pain 0-No pain   Body mass index is 28.8 kg/m.     07/15/2022   11:36 AM 07/11/2021    9:46 AM 01/06/2015    1:55 PM  Advanced Directives  Does Patient Have a Medical Advance Directive? Yes No No  Type of Advance Directive Living will    Does patient want to make changes to medical advance directive? Yes (MAU/Ambulatory/Procedural Areas - Information given)    Would patient like information on creating a medical advance directive?  No - Patient declined No - patient declined information    Current Medications (verified) Outpatient Encounter Medications as of 07/15/2022  Medication Sig   amLODipine (NORVASC) 5 MG tablet Take 1 tablet (5 mg total) by mouth daily.   aspirin 81 MG tablet Take 1 tablet by mouth daily.   atorvastatin (LIPITOR) 20 MG tablet TAKE 1 TABLET(20 MG) BY MOUTH DAILY   cetirizine (ZYRTEC) 10 MG tablet Take 1 tablet (10 mg total) by mouth daily. (Patient taking differently: Take 10 mg by mouth as needed.)   pantoprazole (PROTONIX) 40 MG tablet Take 1 tablet (40 mg total) by mouth daily.   clotrimazole-betamethasone (LOTRISONE) cream Apply 1 application. topically daily. (Patient not taking: Reported on 07/15/2022)   fluticasone (FLONASE) 50 MCG/ACT nasal spray Place 2 sprays into both nostrils daily. (Patient not taking: Reported on 07/15/2022)   montelukast (SINGULAIR) 10 MG tablet Take 1 tablet (10 mg total) by mouth at bedtime. (Patient not taking: Reported on 07/15/2022)    No facility-administered encounter medications on file as of 07/15/2022.    Allergies (verified) Hydrochlorothiazide   History: Past Medical History:  Diagnosis Date   Hyperlipidemia    Hypertension    No past surgical history on file. No family history on file. Social History   Socioeconomic History   Marital status: Widowed    Spouse name: Not on file   Number of children: 3   Years of education: Not on file   Highest education level: Not on file  Occupational History   Occupation: Retired  Tobacco Use   Smoking status: Former    Types: Cigarettes   Smokeless tobacco: Former    Quit date: 07/01/2006  Vaping Use   Vaping Use: Never used  Substance and Sexual Activity   Alcohol use: Yes    Alcohol/week: 0.0 standard drinks of alcohol    Comment: occas   Drug use: No   Sexual activity: Not on file  Other Topics Concern   Not on file  Social History Narrative   Not on file   Social Determinants of Health   Financial Resource Strain: Low Risk  (07/15/2022)   Overall Financial Resource Strain (CARDIA)    Difficulty of Paying Living Expenses: Not hard at all  Food Insecurity: No Food Insecurity (07/15/2022)   Hunger Vital Sign    Worried About Running Out of  Food in the Last Year: Never true    Ran Out of Food in the Last Year: Never true  Transportation Needs: No Transportation Needs (07/15/2022)   PRAPARE - Administrator, Civil Service (Medical): No    Lack of Transportation (Non-Medical): No  Physical Activity: Sufficiently Active (07/15/2022)   Exercise Vital Sign    Days of Exercise per Week: 7 days    Minutes of Exercise per Session: 30 min  Stress: No Stress Concern Present (07/15/2022)   [employer] of Occupational Health - Occupational Stress Questionnaire    Feeling of Stress : Not at all  Social Connections: Moderately Isolated (07/11/2021)   Social Connection and Isolation Panel [NHANES]    Frequency of Communication with Friends and  Family: More than three times a week    Frequency of Social Gatherings with Friends and Family: More than three times a week    Attends Religious Services: Never    Database administrator or Organizations: No    Attends Engineer, structural: Never    Marital Status: Married    Tobacco Counseling Counseling given: Not Answered   Clinical Intake:  Pre-visit preparation completed: No  Pain : No/denies pain Pain Score: 0-No pain     Nutritional Status: BMI 25 -29 Overweight Nutritional Risks: None Diabetes: No     Diabetic?No    Interpreter Needed?: No  Information entered by :: Laurel Dimmer, CMA   Activities of Daily Living    07/15/2022   11:29 AM  In your present state of health, do you have any difficulty performing the following activities:  Hearing? 0  Vision? 1  Comment [facility]  Difficulty concentrating or making decisions? 0  Walking or climbing stairs? 0  Dressing or bathing? 0  Doing errands, shopping? 0    Patient Care Team: Duanne Limerick, MD as PCP - General (Family Medicine)  Indicate any recent Medical Services you may have received from other than [facility] providers in the past year (date may be approximate).     Assessment:   This is a routine wellness examination for Kenneth Velasquez.  Hearing/Vision screen Hearing Screening - Comments:: Pt denies hearing difficulty Vision Screening - Comments:: [facility] screenings [facility] at [facility]   Dietary issues and exercise activities discussed: Current Exercise Habits: Structured exercise class, Type of exercise: Other - see comments, Time (Minutes): 30, Frequency (Times/Week): 2, Weekly Exercise (Minutes/Week): 60, Intensity: Mild, Exercise limited by: None identified   Goals Addressed   None    Depression Screen    07/15/2022   11:28 AM 03/21/2022    8:13 AM 12/11/2021    9:22 AM 10/30/2021    8:10 AM 09/18/2021    7:59 AM 07/11/2021    9:45 AM 03/20/2021    7:54 AM   PHQ 2/9 Scores  PHQ - 2 Score 0 0 1 0 0 0 0  PHQ- 9 Score  0 4 0 0  0    Fall Risk    03/21/2022    8:13 AM 07/11/2021    9:46 AM 03/20/2021    7:54 AM 03/31/2020    8:18 AM 09/24/2019    7:58 AM  Fall Risk   Falls in the past year? 0 0 0 0 0  Number falls in past yr: 0 0 0    Injury with Fall? 0 0 0    Risk for fall due to : No Fall Risks No Fall Risks No Fall Risks  Follow up Falls evaluation completed Falls prevention discussed Falls evaluation completed Falls evaluation completed Falls evaluation completed    FALL RISK PREVENTION PERTAINING TO THE HOME:  Any stairs in or around the home? Yes  If so, are there any without handrails? No  Home free of loose throw rugs in walkways, pet beds, electrical cords, etc? No  Adequate lighting in your home to reduce risk of falls? Yes   ASSISTIVE DEVICES UTILIZED TO PREVENT FALLS:  Life alert? No  Use of a cane, walker or w/c? No  Grab bars in the bathroom? No  Shower chair or bench in shower? No  Elevated toilet seat or a handicapped toilet? No   TIMED UP AND GO:  Was the test performed? Yes .  Length of time to ambulate 10 feet: 10 sec.   Gait steady and fast without use of assistive device  Cognitive Function:        07/15/2022   11:30 AM  6CIT Screen  What Year? 0 points  What month? 0 points  What time? 0 points  Count back from 20 0 points  Months in reverse 4 points  Repeat phrase 10 points  Total Score 14 points    Immunizations Immunization History  Administered Date(s) Administered   Fluad Quad(high Dose 65+) 04/10/2021, 03/21/2022   Influenza Inj Mdck Quad With Preservative 04/26/2018   Influenza Split 04/01/2019   Influenza,inj,Quad PF,6+ Mos 03/13/2016, 03/31/2020   PNEUMOCOCCAL CONJUGATE-20 04/25/2022   Tdap 01/30/2015    TDAP status: Up to date  Flu Vaccine status: Up to date  Pneumococcal vaccine status: Up to date  Covid-19 vaccine status: Declined, Education has been provided  regarding the importance of this vaccine but patient still declined. Advised may receive this vaccine at local pharmacy or Health Dept.or vaccine clinic. Aware to provide a copy of the vaccination record if obtained from local pharmacy or Health Dept. Verbalized acceptance and understanding.  Qualifies for Shingles Vaccine? Yes   Zostavax completed No   Shingrix Completed?: No.    Education has been provided regarding the importance of this vaccine. Patient has been advised to call insurance company to determine out of pocket expense if they have not yet received this vaccine. Advised may also receive vaccine at local pharmacy or Health Dept. Verbalized acceptance and understanding.  Screening Tests Health Maintenance  Topic Date Due   Zoster Vaccines- Shingrix (1 of 2) Never done   COLONOSCOPY (Pts 45-63yrs Insurance coverage will need to be confirmed)  09/19/2022 (Originally 07/01/2018)   Medicare Annual Wellness (AWV)  07/16/2023   DTaP/Tdap/Td (2 - Td or Tdap) 01/29/2025   Pneumonia Vaccine 58+ Years old  Completed   INFLUENZA VACCINE  Completed   Hepatitis C Screening  Completed   HPV VACCINES  Aged Out   COVID-19 Vaccine  Discontinued    Health Maintenance  Health Maintenance Due  Topic Date Due   Zoster Vaccines- Shingrix (1 of 2) Never done    Colorectal Screening: the pt declined scheduling at this time because of how much he had to pay out of pocket the last time he had it done, prior to Medicare. He stated he will contact his insurance to find out the coverage.   Lung Cancer Screening: (Low Dose CT Chest recommended if Age 50-80 years, 30 pack-year currently smoking OR have quit w/in 15years.) does not qualify.   Lung Cancer Screening Referral: not applicable   Additional Screening:  Hepatitis C Screening: does qualify; Completed 07/24/2017  Vision  Screening: Recommended annual ophthalmology exams for early detection of glaucoma and other disorders of the eye. Is the  patient up to date with their annual eye exam?  Yes  Who is the provider or what is the name of the office in which the patient attends annual eye exams? [facility]  If pt is not established with a provider, would they like to be referred to a provider to establish care? No .   Dental Screening: Recommended annual dental exams for proper oral hygiene  Community Resource Referral / Chronic Care Management: CRR required this visit?  No   CCM required this visit?  No      Plan:     I have personally reviewed and noted the following in the patient's chart:   Medical and social history Use of alcohol, tobacco or illicit drugs  Current medications and supplements including opioid prescriptions. Patient is not currently taking opioid prescriptions. Functional ability and status Nutritional status Physical activity Advanced directives List of other physicians Hospitalizations, surgeries, and ER visits in previous 12 months Vitals Screenings to include cognitive, depression, and falls Referrals and appointments  In addition, I have reviewed and discussed with patient certain preventive protocols, quality metrics, and best practice recommendations. A written personalized care plan for preventive services as well as general preventive health recommendations were provided to patient.    Mr. Tat , Thank you for taking time to come for your Medicare Wellness Visit. I appreciate your ongoing commitment to your health goals. Please review the following plan we discussed and let me know if I can assist you in the future.   These are the goals we discussed:  Goals      Increase physical activity     Recommend increasing physical activity to at least 3 days per week         This is a list of the screening recommended for you and due dates:  Health Maintenance  Topic Date Due   Zoster (Shingles) Vaccine (1 of 2) Never done   Colon Cancer Screening  09/19/2022*   Medicare  Annual Wellness Visit  07/16/2023   DTaP/Tdap/Td vaccine (2 - Td or Tdap) 01/29/2025   Pneumonia Vaccine  Completed   Flu Shot  Completed   Hepatitis C Screening: USPSTF Recommendation to screen - Ages 18-79 yo.  Completed   HPV Vaccine  Aged Out   COVID-19 Vaccine  Discontinued  *Topic was postponed. The date shown is not the original due date.     Wilson Singer, [place]   07/15/2022   Nurse Notes: Approximately 30 minute Face -To-Face Medicare Wellness Visit

## 2022-07-15 NOTE — Patient Instructions (Signed)
Health Maintenance, Male Adopting a healthy lifestyle and getting preventive care are important in promoting health and wellness. Ask your health care provider about: The right schedule for you to have regular tests and exams. Things you can do on your own to prevent diseases and keep yourself healthy. What should I know about diet, weight, and exercise? Eat a healthy diet  Eat a diet that includes plenty of vegetables, fruits, low-fat dairy products, and lean protein. Do not eat a lot of foods that are high in solid fats, added sugars, or sodium. Maintain a healthy weight Body mass index (BMI) is a measurement that can be used to identify possible weight problems. It estimates body fat based on height and weight. Your health care provider can help determine your BMI and help you achieve or maintain a healthy weight. Get regular exercise Get regular exercise. This is one of the most important things you can do for your health. Most adults should: Exercise for at least 150 minutes each week. The exercise should increase your heart rate and make you sweat (moderate-intensity exercise). Do strengthening exercises at least twice a week. This is in addition to the moderate-intensity exercise. Spend less time sitting. Even light physical activity can be beneficial. Watch cholesterol and blood lipids Have your blood tested for lipids and cholesterol at 68 years of age, then have this test every 5 years. You may need to have your cholesterol levels checked more often if: Your lipid or cholesterol levels are high. You are older than 68 years of age. You are at high risk for heart disease. What should I know about cancer screening? Many types of cancers can be detected early and may often be prevented. Depending on your health history and family history, you may need to have cancer screening at various ages. This may include screening for: Colorectal cancer. Prostate cancer. Skin cancer. Lung  cancer. What should I know about heart disease, diabetes, and high blood pressure? Blood pressure and heart disease High blood pressure causes heart disease and increases the risk of stroke. This is more likely to develop in people who have high blood pressure readings or are overweight. Talk with your health care provider about your target blood pressure readings. Have your blood pressure checked: Every 3-5 years if you are 18-39 years of age. Every year if you are 40 years old or older. If you are between the ages of 65 and 75 and are a current or former smoker, ask your health care provider if you should have a one-time screening for abdominal aortic aneurysm (AAA). Diabetes Have regular diabetes screenings. This checks your fasting blood sugar level. Have the screening done: Once every three years after age 45 if you are at a normal weight and have a low risk for diabetes. More often and at a younger age if you are overweight or have a high risk for diabetes. What should I know about preventing infection? Hepatitis B If you have a higher risk for hepatitis B, you should be screened for this virus. Talk with your health care provider to find out if you are at risk for hepatitis B infection. Hepatitis C Blood testing is recommended for: Everyone born from 1945 through 1965. Anyone with known risk factors for hepatitis C. Sexually transmitted infections (STIs) You should be screened each year for STIs, including gonorrhea and chlamydia, if: You are sexually active and are younger than 68 years of age. You are older than 68 years of age and your   health care provider tells you that you are at risk for this type of infection. Your sexual activity has changed since you were last screened, and you are at increased risk for chlamydia or gonorrhea. Ask your health care provider if you are at risk. Ask your health care provider about whether you are at high risk for HIV. Your health care provider  may recommend a prescription medicine to help prevent HIV infection. If you choose to take medicine to prevent HIV, you should first get tested for HIV. You should then be tested every 3 months for as long as you are taking the medicine. Follow these instructions at home: Alcohol use Do not drink alcohol if your health care provider tells you not to drink. If you drink alcohol: Limit how much you have to 0-2 drinks a day. Know how much alcohol is in your drink. In the [place] one drink equals one 12 oz bottle of beer (355 mL), one 5 oz glass of wine (148 mL), or one 1 oz glass of hard liquor (44 mL). Lifestyle Do not use any products that contain nicotine or tobacco. These products include cigarettes, chewing tobacco, and vaping devices, such as e-cigarettes. If you need help quitting, ask your health care provider. Do not use street drugs. Do not share needles. Ask your health care provider for help if you need support or information about quitting drugs. General instructions Schedule regular health, dental, and eye exams. Stay current with your vaccines. Tell your health care provider if: You often feel depressed. You have ever been abused or do not feel safe at home. Summary Adopting a healthy lifestyle and getting preventive care are important in promoting health and wellness. Follow your health care provider's instructions about healthy diet, exercising, and getting tested or screened for diseases. Follow your health care provider's instructions on monitoring your cholesterol and blood pressure. This information is not intended to replace advice given to you by your health care provider. Make sure you discuss any questions you have with your health care provider. Document Revised: 11/06/2020 Document Reviewed: 11/06/2020 Elsevier Patient Education  2023 [employer]  

## 2022-09-19 ENCOUNTER — Ambulatory Visit (INDEPENDENT_AMBULATORY_CARE_PROVIDER_SITE_OTHER): Payer: Medicare Other | Admitting: Family Medicine

## 2022-09-19 ENCOUNTER — Encounter: Payer: Self-pay | Admitting: Family Medicine

## 2022-09-19 VITALS — BP 126/74 | HR 67 | Ht 70.0 in | Wt 188.0 lb

## 2022-09-19 DIAGNOSIS — E785 Hyperlipidemia, unspecified: Secondary | ICD-10-CM

## 2022-09-19 DIAGNOSIS — K219 Gastro-esophageal reflux disease without esophagitis: Secondary | ICD-10-CM

## 2022-09-19 DIAGNOSIS — I10 Essential (primary) hypertension: Secondary | ICD-10-CM

## 2022-09-19 MED ORDER — PANTOPRAZOLE SODIUM 40 MG PO TBEC: 40.0000 mg | DELAYED_RELEASE_TABLET | Freq: Every day | ORAL | 1 refills | Status: DC

## 2022-09-19 MED ORDER — AMLODIPINE BESYLATE 5 MG PO TABS: 5.0000 mg | ORAL_TABLET | Freq: Every day | ORAL | 1 refills | Status: DC

## 2022-09-19 MED ORDER — ATORVASTATIN CALCIUM 20 MG PO TABS: ORAL_TABLET | ORAL | 1 refills | Status: DC

## 2022-09-19 NOTE — Progress Notes (Signed)
Date:  09/19/2022   Name:  Kenneth Velasquez   DOB:  1956-02-14   MRN:  [id number]   Chief Complaint: Gastroesophageal Reflux, Hypertension, and Hyperlipidemia  Gastroesophageal Reflux He reports no abdominal pain, no belching, no chest pain, no choking, no coughing, no dysphagia, no early satiety, no globus sensation, no heartburn, no hoarse voice, no nausea, no sore throat, no stridor, no tooth decay, no water brash or no wheezing. This is a chronic problem. The current episode started more than 1 year ago. The problem occurs occasionally. The problem has been gradually improving. The symptoms are aggravated by certain foods. Pertinent negatives include no melena. He has tried a PPI for the symptoms. The treatment provided moderate relief.  Hypertension This is a chronic problem. The current episode started more than 1 year ago. The problem has been waxing and waning since onset. Pertinent negatives include no anxiety, blurred vision, chest pain, headaches, malaise/fatigue, neck pain, orthopnea, palpitations, peripheral edema, PND, shortness of breath or sweats. There are no associated agents to hypertension. Risk factors for coronary artery disease include dyslipidemia. Past treatments include calcium channel blockers. The current treatment provides moderate improvement. There are no compliance problems.  There is no history of angina, CAD/MI or CVA. There is no history of chronic renal disease, a hypertension causing med or renovascular disease.  Hyperlipidemia This is a chronic problem. The current episode started more than 1 year ago. The problem is controlled. Recent lipid tests were reviewed and are normal. He has no history of chronic renal disease, diabetes, hypothyroidism, liver disease, obesity or nephrotic syndrome. Pertinent negatives include no chest pain, focal sensory loss, myalgias or shortness of breath. Current antihyperlipidemic treatment includes statins. The current treatment  provides moderate improvement of lipids. There are no compliance problems.  Risk factors for coronary artery disease include hypertension.    Lab Results  Component Value Date   NA 140 03/21/2022   K 4.2 03/21/2022   CO2 21 03/21/2022   GLUCOSE 99 03/21/2022   BUN 13 03/21/2022   CREATININE 1.07 03/21/2022   CALCIUM 9.5 03/21/2022   EGFR 77 03/21/2022   GFRNONAA 74 03/31/2020   Lab Results  Component Value Date   CHOL 190 10/30/2021   HDL 44 10/30/2021   LDLCALC 115 (H) 10/30/2021   TRIG 174 (H) 10/30/2021   CHOLHDL 5.6 (H) 07/24/2017   No results found for: "TSH" No results found for: "HGBA1C" Lab Results  Component Value Date   WBC 6.6 01/06/2015   HGB 14.9 01/06/2015   HCT 43.7 01/06/2015   MCV 90.8 01/06/2015   PLT 255 01/06/2015   Lab Results  Component Value Date   ALT 42 03/21/2022   AST 39 03/21/2022   ALKPHOS 90 03/21/2022   BILITOT 0.5 03/21/2022   No results found for: "25OHVITD2", "25OHVITD3", "VD25OH"   Review of Systems  Constitutional:  Negative for chills, fever and malaise/fatigue.  HENT:  Negative for congestion, drooling, ear discharge, ear pain, hoarse voice, sinus pressure, sinus pain and sore throat.   Eyes:  Negative for blurred vision and visual disturbance.  Respiratory:  Negative for cough, choking, shortness of breath and wheezing.   Cardiovascular:  Negative for chest pain, palpitations, orthopnea, leg swelling and PND.  Gastrointestinal:  Negative for abdominal pain, blood in stool, constipation, diarrhea, dysphagia, heartburn, melena and nausea.  Endocrine: Negative for polydipsia and polyuria.  Genitourinary:  Negative for dysuria, frequency, hematuria and urgency.  Musculoskeletal:  Negative for back pain, myalgias  and neck pain.  Skin:  Negative for rash.  Allergic/Immunologic: Negative for environmental allergies.  Neurological:  Negative for dizziness and headaches.  Hematological:  Does not bruise/bleed easily.   Psychiatric/Behavioral:  Negative for suicidal ideas. The patient is not nervous/anxious.     Patient Active Problem List   Diagnosis Date Noted   Dyslipidemia 07/24/2017   Routine general medical examination at a health care facility 11/08/2014   Benign essential HTN 11/08/2014   Screening for depression 11/08/2014   Hypercholesteremia 11/08/2014   H/O arthritis 11/08/2014    Allergies  Allergen Reactions   Hydrochlorothiazide Rash    No past surgical history on file.  Social History   Tobacco Use   Smoking status: Former    Types: Cigarettes   Smokeless tobacco: Former    Quit date: 07/01/2006  Vaping Use   Vaping Use: Never used  Substance Use Topics   Alcohol use: Yes    Alcohol/week: 0.0 standard drinks of alcohol    Comment: occas   Drug use: No     Medication list has been reviewed and updated.  Current Meds  Medication Sig   amLODipine (NORVASC) 5 MG tablet Take 1 tablet (5 mg total) by mouth daily.   aspirin 81 MG tablet Take 1 tablet by mouth daily.   atorvastatin (LIPITOR) 20 MG tablet TAKE 1 TABLET(20 MG) BY MOUTH DAILY   cetirizine (ZYRTEC) 10 MG tablet Take 1 tablet (10 mg total) by mouth daily. (Patient taking differently: Take 10 mg by mouth as needed.)   pantoprazole (PROTONIX) 40 MG tablet Take 1 tablet (40 mg total) by mouth daily.       09/19/2022    9:06 AM 03/21/2022    8:14 AM 12/11/2021    9:22 AM 10/30/2021    8:11 AM  GAD 7 : Generalized Anxiety Score  Nervous, Anxious, on Edge 0 0 1 0  Control/stop worrying 0 0 0 0  Worry too much - different things 0 0 0 0  Trouble relaxing 0 0 0 0  Restless 0 0 2 0  Easily annoyed or irritable 0 0 0 0  Afraid - awful might happen 0 0 0 0  Total GAD 7 Score 0 0 3 0  Anxiety Difficulty Not difficult at all Not difficult at all Not difficult at all Not difficult at all       09/19/2022    9:06 AM 07/15/2022   11:28 AM 03/21/2022    8:13 AM  Depression screen PHQ 2/9  Decreased Interest 0 0 0   Down, Depressed, Hopeless 0 0 0  PHQ - 2 Score 0 0 0  Altered sleeping 0  0  Tired, decreased energy 0  0  Change in appetite 0  0  Feeling bad or failure about yourself  0  0  Trouble concentrating 0  0  Moving slowly or fidgety/restless 0  0  Suicidal thoughts 0  0  PHQ-9 Score 0  0  Difficult doing work/chores Not difficult at all  Not difficult at all    BP Readings from Last 3 Encounters:  09/19/22 126/74  07/15/22 126/78  03/21/22 130/88    Physical Exam Vitals and nursing note reviewed.  HENT:     Head: Normocephalic.     Right Ear: External ear normal.     Left Ear: External ear normal.     Nose: Nose normal.  Eyes:     General: No scleral icterus.  Right eye: No discharge.        Left eye: No discharge.     Conjunctiva/sclera: Conjunctivae normal.     Pupils: Pupils are equal, round, and reactive to light.  Neck:     Thyroid: No thyromegaly.     Vascular: No JVD.     Trachea: No tracheal deviation.  Cardiovascular:     Rate and Rhythm: Normal rate and regular rhythm.     Heart sounds: Normal heart sounds. No murmur heard.    No friction rub. No gallop.  Pulmonary:     Effort: No respiratory distress.     Breath sounds: Normal breath sounds. No wheezing or rales.  Abdominal:     General: Bowel sounds are normal.     Palpations: Abdomen is soft. There is no mass.     Tenderness: There is no abdominal tenderness. There is no guarding or rebound.  Musculoskeletal:        General: No tenderness. Normal range of motion.     Cervical back: Normal range of motion and neck supple.  Lymphadenopathy:     Cervical: No cervical adenopathy.  Skin:    General: Skin is warm.     Findings: No rash.  Neurological:     Mental Status: He is alert and oriented to person, place, and time.     Cranial Nerves: No cranial nerve deficit.     Deep Tendon Reflexes: Reflexes are normal and symmetric.     Wt Readings from Last 3 Encounters:  09/19/22 188 lb (85.3 kg)   07/15/22 190 lb (86.2 kg)  03/21/22 183 lb (83 kg)    BP 126/74   Pulse 67   Ht 5\' 10"  (1.778 m)   Wt 188 lb (85.3 kg)   SpO2 98%   BMI 26.98 kg/m   Assessment and Plan: 1. Benign essential HTN Chronic.  Controlled.  Stable.  Blood pressure today is 126/74.  Asymptomatic.  Tolerating medication well.  Continue amlodipine 5 mg once a day.  Will check renal function panel for GFR and electrolytes evaluation.  Will recheck patient in 6 months. - amLODipine (NORVASC) 5 MG tablet; Take 1 tablet (5 mg total) by mouth daily.  Dispense: 90 tablet; Refill: 1 - Renal Function Panel  2. Dyslipidemia Chronic.  Controlled.  Stable.  Pending lipid panel we will continue with atorvastatin 20 mg once a day and increase if necessary.  Recheck patient in 6 months. - atorvastatin (LIPITOR) 20 MG tablet; TAKE 1 TABLET(20 MG) BY MOUTH DAILY  Dispense: 90 tablet; Refill: 1 - Lipid Panel With LDL/HDL Ratio  3. Gastroesophageal reflux disease, unspecified whether esophagitis present Chronic.  Controlled.  Stable.  Patient is asymptomatic when he takes pantoprazole 40 mg once a day and we will continue at current dosing.  Will recheck patient in 6 months. - pantoprazole (PROTONIX) 40 MG tablet; Take 1 tablet (40 mg total) by mouth daily.  Dispense: 90 tablet; Refill: 1     Otilio Miu, MD

## 2022-09-20 LAB — RENAL FUNCTION PANEL
Albumin: 4.6 g/dL (ref 3.9–4.9)
BUN/Creatinine Ratio: 11 (ref 10–24)
BUN: 12 mg/dL (ref 8–27)
CO2: 21 mmol/L (ref 20–29)
Calcium: 9.8 mg/dL (ref 8.6–10.2)
Chloride: 102 mmol/L (ref 96–106)
Creatinine, Ser: 1.1 mg/dL (ref 0.76–1.27)
Glucose: 99 mg/dL (ref 70–99)
Phosphorus: 3.4 mg/dL (ref 2.8–4.1)
Potassium: 4.2 mmol/L (ref 3.5–5.2)
Sodium: 141 mmol/L (ref 134–144)
eGFR: 74 mL/min/{1.73_m2} (ref >59)

## 2022-09-20 LAB — LIPID PANEL WITH LDL/HDL RATIO
Cholesterol, Total: 199 mg/dL (ref 100–199)
HDL: 37 mg/dL — ABNORMAL LOW (ref >39)
LDL Chol Calc (NIH): 130 mg/dL — ABNORMAL HIGH (ref 0–99)
LDL/HDL Ratio: 3.5 ratio (ref 0.0–3.6)
Triglycerides: 176 mg/dL — ABNORMAL HIGH (ref 0–149)
VLDL Cholesterol Cal: 32 mg/dL (ref 5–40)

## 2023-03-24 ENCOUNTER — Encounter: Payer: Self-pay | Admitting: Family Medicine

## 2023-03-24 ENCOUNTER — Ambulatory Visit (INDEPENDENT_AMBULATORY_CARE_PROVIDER_SITE_OTHER): Payer: Medicare Other | Admitting: Family Medicine

## 2023-03-24 VITALS — BP 138/70 | HR 88 | Ht 70.0 in | Wt 188.0 lb

## 2023-03-24 DIAGNOSIS — Z23 Encounter for immunization: Secondary | ICD-10-CM

## 2023-03-24 DIAGNOSIS — I10 Essential (primary) hypertension: Secondary | ICD-10-CM

## 2023-03-24 DIAGNOSIS — K219 Gastro-esophageal reflux disease without esophagitis: Secondary | ICD-10-CM

## 2023-03-24 DIAGNOSIS — E785 Hyperlipidemia, unspecified: Secondary | ICD-10-CM | POA: Diagnosis not present

## 2023-03-24 MED ORDER — ATORVASTATIN CALCIUM 20 MG PO TABS: ORAL_TABLET | ORAL | 1 refills | Status: DC

## 2023-03-24 MED ORDER — PANTOPRAZOLE SODIUM 40 MG PO TBEC
40.0000 mg | DELAYED_RELEASE_TABLET | Freq: Every day | ORAL | 1 refills | Status: DC
Start: 2023-03-24 — End: 2023-09-02

## 2023-03-24 MED ORDER — AMLODIPINE BESYLATE 5 MG PO TABS: 5.0000 mg | ORAL_TABLET | Freq: Every day | ORAL | 1 refills | Status: DC

## 2023-03-24 NOTE — Progress Notes (Signed)
Date:  03/24/2023   Name:  Kenneth Velasquez   DOB:  Apr 24, 1956   MRN:  [id number]   Chief Complaint: Flu Vaccine, Hypertension, Hyperlipidemia, and Gastroesophageal Reflux  Hypertension This is a chronic problem. The current episode started more than 1 year ago. The problem has been gradually improving since onset. The problem is controlled. Pertinent negatives include no blurred vision, chest pain, headaches, orthopnea, palpitations, PND or shortness of breath. There are no associated agents to hypertension. Risk factors for coronary artery disease include dyslipidemia. Past treatments include calcium channel blockers. The current treatment provides moderate improvement. There are no compliance problems.  There is no history of CAD/MI or CVA. There is no history of chronic renal disease, a hypertension causing med or renovascular disease.  Hyperlipidemia This is a chronic problem. The current episode started more than 1 year ago. The problem is controlled. Recent lipid tests were reviewed and are normal. He has no history of chronic renal disease or diabetes. Pertinent negatives include no chest pain or shortness of breath. He is currently on no antihyperlipidemic treatment. The current treatment provides moderate improvement of lipids. There are no compliance problems.   Gastroesophageal Reflux He reports no abdominal pain, no chest pain, no choking, no dysphagia, no heartburn or no wheezing. This is a chronic problem. The current episode started more than 1 year ago. The problem has been gradually improving. The symptoms are aggravated by certain foods. Pertinent negatives include no anemia or melena. He has tried a PPI for the symptoms. The treatment provided moderate relief.    Lab Results  Component Value Date   NA 141 09/19/2022   K 4.2 09/19/2022   CO2 21 09/19/2022   GLUCOSE 99 09/19/2022   BUN 12 09/19/2022   CREATININE 1.10 09/19/2022   CALCIUM 9.8 09/19/2022   EGFR 74  09/19/2022   GFRNONAA 74 03/31/2020   Lab Results  Component Value Date   CHOL 199 09/19/2022   HDL 37 (L) 09/19/2022   LDLCALC 130 (H) 09/19/2022   TRIG 176 (H) 09/19/2022   CHOLHDL 5.6 (H) 07/24/2017   No results found for: "TSH" No results found for: "HGBA1C" Lab Results  Component Value Date   WBC 6.6 01/06/2015   HGB 14.9 01/06/2015   HCT 43.7 01/06/2015   MCV 90.8 01/06/2015   PLT 255 01/06/2015   Lab Results  Component Value Date   ALT 42 03/21/2022   AST 39 03/21/2022   ALKPHOS 90 03/21/2022   BILITOT 0.5 03/21/2022   No results found for: "25OHVITD2", "25OHVITD3", "VD25OH"   Review of Systems  Constitutional:  Negative for unexpected weight change.  Eyes:  Negative for blurred vision and visual disturbance.  Respiratory:  Negative for choking, shortness of breath and wheezing.   Cardiovascular:  Negative for chest pain, palpitations, orthopnea and PND.  Gastrointestinal:  Negative for abdominal pain, dysphagia, heartburn and melena.  Genitourinary:  Negative for difficulty urinating.  Neurological:  Negative for headaches.    Patient Active Problem List   Diagnosis Date Noted   Dyslipidemia 07/24/2017   Routine general medical examination at a health care facility 11/08/2014   Benign essential HTN 11/08/2014   Screening for depression 11/08/2014   Hypercholesteremia 11/08/2014   H/O arthritis 11/08/2014    Allergies  Allergen Reactions   Hydrochlorothiazide Rash    No past surgical history on file.  Social History   Tobacco Use   Smoking status: Former    Types: Cigarettes   Smokeless  tobacco: Former    Quit date: 07/01/2006  Vaping Use   Vaping status: Never Used  Substance Use Topics   Alcohol use: Yes    Alcohol/week: 0.0 standard drinks of alcohol    Comment: occas   Drug use: No     Medication list has been reviewed and updated.  Current Meds  Medication Sig   amLODipine (NORVASC) 5 MG tablet Take 1 tablet (5 mg total) by mouth  daily.   aspirin 81 MG tablet Take 1 tablet by mouth daily.   atorvastatin (LIPITOR) 20 MG tablet TAKE 1 TABLET(20 MG) BY MOUTH DAILY   cetirizine (ZYRTEC) 10 MG tablet Take 1 tablet (10 mg total) by mouth daily. (Patient taking differently: Take 10 mg by mouth as needed.)   pantoprazole (PROTONIX) 40 MG tablet Take 1 tablet (40 mg total) by mouth daily.       03/24/2023    9:27 AM 09/19/2022    9:06 AM 03/21/2022    8:14 AM 12/11/2021    9:22 AM  GAD 7 : Generalized Anxiety Score  Nervous, Anxious, on Edge 0 0 0 1  Control/stop worrying 0 0 0 0  Worry too much - different things 0 0 0 0  Trouble relaxing 0 0 0 0  Restless 0 0 0 2  Easily annoyed or irritable 0 0 0 0  Afraid - awful might happen 0 0 0 0  Total GAD 7 Score 0 0 0 3  Anxiety Difficulty Not difficult at all Not difficult at all Not difficult at all Not difficult at all       03/24/2023    9:27 AM 09/19/2022    9:06 AM 07/15/2022   11:28 AM  Depression screen PHQ 2/9  Decreased Interest 0 0 0  Down, Depressed, Hopeless 0 0 0  PHQ - 2 Score 0 0 0  Altered sleeping 0 0   Tired, decreased energy 0 0   Change in appetite 0 0   Feeling bad or failure about yourself  0 0   Trouble concentrating 0 0   Moving slowly or fidgety/restless 0 0   Suicidal thoughts 0 0   PHQ-9 Score 0 0   Difficult doing work/chores Not difficult at all Not difficult at all     BP Readings from Last 3 Encounters:  03/24/23 138/70  09/19/22 126/74  07/15/22 126/78    Physical Exam Vitals and nursing note reviewed.  HENT:     Head: Normocephalic.     Right Ear: External ear normal.     Left Ear: External ear normal.     Nose: Nose normal.     Mouth/Throat:     Mouth: Mucous membranes are moist.  Eyes:     General: No scleral icterus.       Right eye: No discharge.        Left eye: No discharge.     Conjunctiva/sclera: Conjunctivae normal.     Pupils: Pupils are equal, round, and reactive to light.  Neck:     Thyroid: No  thyromegaly.     Vascular: No JVD.     Trachea: No tracheal deviation.  Cardiovascular:     Rate and Rhythm: Normal rate and regular rhythm.     Pulses: Normal pulses.     Heart sounds: Normal heart sounds, S1 normal and S2 normal. No murmur heard.    No systolic murmur is present.     No diastolic murmur is present.  No friction rub. No gallop. No S3 or S4 sounds.  Pulmonary:     Effort: No respiratory distress.     Breath sounds: Normal breath sounds. No wheezing, rhonchi or rales.  Abdominal:     General: Bowel sounds are normal.     Palpations: Abdomen is soft. There is no mass.     Tenderness: There is no abdominal tenderness. There is no guarding or rebound.  Musculoskeletal:        General: No tenderness. Normal range of motion.     Cervical back: Normal range of motion and neck supple.  Lymphadenopathy:     Cervical: No cervical adenopathy.  Skin:    General: Skin is warm.     Findings: No rash.  Neurological:     Mental Status: He is alert and oriented to person, place, and time.     Cranial Nerves: No cranial nerve deficit.     Deep Tendon Reflexes: Reflexes are normal and symmetric.     Wt Readings from Last 3 Encounters:  03/24/23 188 lb (85.3 kg)  09/19/22 188 lb (85.3 kg)  07/15/22 190 lb (86.2 kg)    BP 138/70   Pulse 88   Ht 5\' 10"  (1.778 m)   Wt 188 lb (85.3 kg)   SpO2 95%   BMI 26.98 kg/m   Assessment and Plan: 1. Benign essential HTN Chronic.  Controlled.  Stable.  Blood pressure today 138/70.  Asymptomatic.  Tolerating medication well.  Continue amlodipine 5 mg once a day.  Will recheck in 6 months.  Will check CMP for electrolytes and GFR. - amLODipine (NORVASC) 5 MG tablet; Take 1 tablet (5 mg total) by mouth daily.  Dispense: 90 tablet; Refill: 1 - Comprehensive Metabolic Panel (CMET)  2. Dyslipidemia Chronic.  Controlled.  Stable.  Continue atorvastatin 20 mg once a day.  Will check AST and ALT with CMP to reassess nonalcoholic hepatic  disease. - atorvastatin (LIPITOR) 20 MG tablet; TAKE 1 TABLET(20 MG) BY MOUTH DAILY  Dispense: 90 tablet; Refill: 1 - Comprehensive Metabolic Panel (CMET)  3. Gastroesophageal reflux disease, unspecified whether esophagitis present Monic.  Controlled.  Stable pantoprazole 40 mg once a day.  Will recheck in 6 months. - pantoprazole (PROTONIX) 40 MG tablet; Take 1 tablet (40 mg total) by mouth daily.  Dispense: 90 tablet; Refill: 1  4. Need for immunization against influenza Discussed and administered. - Flu Vaccine Trivalent High Dose (Fluad)     Elizabeth Sauer, MD

## 2023-03-25 LAB — COMPREHENSIVE METABOLIC PANEL
ALT: 33 IU/L (ref 0–44)
AST: 33 IU/L (ref 0–40)
Albumin: 4.5 g/dL (ref 3.9–4.9)
Alkaline Phosphatase: 98 IU/L (ref 44–121)
BUN/Creatinine Ratio: 10 (ref 10–24)
BUN: 11 mg/dL (ref 8–27)
Bilirubin Total: 0.4 mg/dL (ref 0.0–1.2)
CO2: 21 mmol/L (ref 20–29)
Calcium: 9.6 mg/dL (ref 8.6–10.2)
Chloride: 103 mmol/L (ref 96–106)
Creatinine, Ser: 1.06 mg/dL (ref 0.76–1.27)
Globulin, Total: 2.7 g/dL (ref 1.5–4.5)
Glucose: 95 mg/dL (ref 70–99)
Potassium: 4.9 mmol/L (ref 3.5–5.2)
Sodium: 140 mmol/L (ref 134–144)
Total Protein: 7.2 g/dL (ref 6.0–8.5)
eGFR: 77 mL/min/{1.73_m2} (ref >59)

## 2023-04-01 DIAGNOSIS — H4323 Crystalline deposits in vitreous body, bilateral: Secondary | ICD-10-CM | POA: Diagnosis not present

## 2023-04-01 DIAGNOSIS — H2513 Age-related nuclear cataract, bilateral: Secondary | ICD-10-CM | POA: Diagnosis not present

## 2023-09-02 ENCOUNTER — Encounter: Payer: Self-pay | Admitting: Family Medicine

## 2023-09-02 ENCOUNTER — Ambulatory Visit (INDEPENDENT_AMBULATORY_CARE_PROVIDER_SITE_OTHER): Payer: Medicare Other | Admitting: Family Medicine

## 2023-09-02 VITALS — BP 128/72 | HR 80 | Ht 70.0 in | Wt 191.0 lb

## 2023-09-02 DIAGNOSIS — E785 Hyperlipidemia, unspecified: Secondary | ICD-10-CM

## 2023-09-02 DIAGNOSIS — K219 Gastro-esophageal reflux disease without esophagitis: Secondary | ICD-10-CM | POA: Diagnosis not present

## 2023-09-02 DIAGNOSIS — J301 Allergic rhinitis due to pollen: Secondary | ICD-10-CM

## 2023-09-02 DIAGNOSIS — I10 Essential (primary) hypertension: Secondary | ICD-10-CM | POA: Diagnosis not present

## 2023-09-02 MED ORDER — ATORVASTATIN CALCIUM 20 MG PO TABS
ORAL_TABLET | ORAL | 1 refills | Status: DC
Start: 2023-09-02 — End: 2024-03-11

## 2023-09-02 MED ORDER — AMLODIPINE BESYLATE 5 MG PO TABS
5.0000 mg | ORAL_TABLET | Freq: Every day | ORAL | 1 refills | Status: DC
Start: 2023-09-02 — End: 2024-03-11

## 2023-09-02 MED ORDER — SHINGRIX 50 MCG/0.5ML IM SUSR: 0.5000 mL | Freq: Once | INTRAMUSCULAR | 0 refills | Status: AC

## 2023-09-02 MED ORDER — PANTOPRAZOLE SODIUM 40 MG PO TBEC: 40.0000 mg | DELAYED_RELEASE_TABLET | Freq: Every day | ORAL | 1 refills | Status: AC

## 2023-09-02 MED ORDER — MONTELUKAST SODIUM 10 MG PO TABS
10.0000 mg | ORAL_TABLET | Freq: Every day | ORAL | 1 refills | Status: DC
Start: 2023-09-02 — End: 2024-05-07

## 2023-09-02 NOTE — Patient Instructions (Signed)

## 2023-09-02 NOTE — Progress Notes (Signed)
 Date:  09/02/2023   Name:  Kenneth Velasquez   DOB:  1956/02/06   MRN:  [id number]   Chief Complaint: Hypertension, Hyperlipidemia, and Gastroesophageal Reflux  Hypertension This is a chronic problem. The current episode started more than 1 year ago. The problem has been gradually improving since onset. The problem is controlled. Pertinent negatives include no anxiety, blurred vision, chest pain, headaches, malaise/fatigue, neck pain, orthopnea, palpitations, peripheral edema, PND, shortness of breath or sweats. There are no associated agents to hypertension. Risk factors for coronary artery disease include dyslipidemia. Past treatments include calcium channel blockers. The current treatment provides moderate improvement. There are no compliance problems.  There is no history of angina, CAD/MI or PVD. There is no history of chronic renal disease, a hypertension causing med or renovascular disease.  Hyperlipidemia This is a chronic problem. The problem is controlled. Recent lipid tests were reviewed and are normal. He has no history of chronic renal disease or diabetes. Pertinent negatives include no chest pain or shortness of breath. Current antihyperlipidemic treatment includes statins. The current treatment provides moderate improvement of lipids. There are no compliance problems.  Risk factors for coronary artery disease include dyslipidemia and hypertension.  Gastroesophageal Reflux He reports no chest pain, no dysphagia, no heartburn, no hoarse voice or no wheezing. This is a chronic problem. The current episode started more than 1 year ago. The problem occurs occasionally. The problem has been gradually improving. The symptoms are aggravated by certain foods. There are no known risk factors. He has tried a PPI for the symptoms. The treatment provided moderate relief.    Lab Results  Component Value Date   NA 140 03/24/2023   K 4.9 03/24/2023   CO2 21 03/24/2023   GLUCOSE 95 03/24/2023    BUN 11 03/24/2023   CREATININE 1.06 03/24/2023   CALCIUM 9.6 03/24/2023   EGFR 77 03/24/2023   GFRNONAA 74 03/31/2020   Lab Results  Component Value Date   CHOL 199 09/19/2022   HDL 37 (L) 09/19/2022   LDLCALC 130 (H) 09/19/2022   TRIG 176 (H) 09/19/2022   CHOLHDL 5.6 (H) 07/24/2017   No results found for: "TSH" No results found for: "HGBA1C" Lab Results  Component Value Date   WBC 6.6 01/06/2015   HGB 14.9 01/06/2015   HCT 43.7 01/06/2015   MCV 90.8 01/06/2015   PLT 255 01/06/2015   Lab Results  Component Value Date   ALT 33 03/24/2023   AST 33 03/24/2023   ALKPHOS 98 03/24/2023   BILITOT 0.4 03/24/2023   No results found for: "25OHVITD2", "25OHVITD3", "VD25OH"   Review of Systems  Constitutional:  Negative for diaphoresis, malaise/fatigue and unexpected weight change.  HENT:  Negative for hoarse voice and trouble swallowing.   Eyes:  Negative for blurred vision and visual disturbance.  Respiratory:  Negative for chest tightness, shortness of breath and wheezing.   Cardiovascular:  Negative for chest pain, palpitations, orthopnea and PND.  Gastrointestinal:  Negative for blood in stool, dysphagia and heartburn.  Genitourinary:  Negative for difficulty urinating and hematuria.  Musculoskeletal:  Negative for neck pain.  Neurological:  Negative for headaches.    Patient Active Problem List   Diagnosis Date Noted   Dyslipidemia 07/24/2017   Routine general medical examination at a health care facility 11/08/2014   Benign essential HTN 11/08/2014   Screening for depression 11/08/2014   Hypercholesteremia 11/08/2014   H/O arthritis 11/08/2014    Allergies  Allergen Reactions   Hydrochlorothiazide  Rash    History reviewed. No pertinent surgical history.  Social History   Tobacco Use   Smoking status: Former    Types: Cigarettes   Smokeless tobacco: Former    Quit date: 07/01/2006  Vaping Use   Vaping status: Never Used  Substance Use Topics   Alcohol  use: Yes    Alcohol/week: 0.0 standard drinks of alcohol    Comment: occas   Drug use: No     Medication list has been reviewed and updated.  Current Meds  Medication Sig   amLODipine (NORVASC) 5 MG tablet Take 1 tablet (5 mg total) by mouth daily.   aspirin 81 MG tablet Take 1 tablet by mouth daily.   atorvastatin (LIPITOR) 20 MG tablet TAKE 1 TABLET(20 MG) BY MOUTH DAILY   cetirizine (ZYRTEC) 10 MG tablet Take 1 tablet (10 mg total) by mouth daily. (Patient taking differently: Take 10 mg by mouth as needed.)   clotrimazole-betamethasone (LOTRISONE) cream Apply 1 application. topically daily.   fluticasone (FLONASE) 50 MCG/ACT nasal spray Place 2 sprays into both nostrils daily.   montelukast (SINGULAIR) 10 MG tablet Take 1 tablet (10 mg total) by mouth at bedtime.   pantoprazole (PROTONIX) 40 MG tablet Take 1 tablet (40 mg total) by mouth daily.       09/02/2023    9:48 AM 03/24/2023    9:27 AM 09/19/2022    9:06 AM 03/21/2022    8:14 AM  GAD 7 : Generalized Anxiety Score  Nervous, Anxious, on Edge 0 0 0 0  Control/stop worrying 0 0 0 0  Worry too much - different things 0 0 0 0  Trouble relaxing 0 0 0 0  Restless 0 0 0 0  Easily annoyed or irritable 0 0 0 0  Afraid - awful might happen 0 0 0 0  Total GAD 7 Score 0 0 0 0  Anxiety Difficulty Not difficult at all Not difficult at all Not difficult at all Not difficult at all       09/02/2023    9:48 AM 03/24/2023    9:27 AM 09/19/2022    9:06 AM  Depression screen PHQ 2/9  Decreased Interest 0 0 0  Down, Depressed, Hopeless 0 0 0  PHQ - 2 Score 0 0 0  Altered sleeping 0 0 0  Tired, decreased energy 0 0 0  Change in appetite 0 0 0  Feeling bad or failure about yourself  0 0 0  Trouble concentrating 0 0 0  Moving slowly or fidgety/restless 0 0 0  Suicidal thoughts 0 0 0  PHQ-9 Score 0 0 0  Difficult doing work/chores Not difficult at all Not difficult at all Not difficult at all    BP Readings from Last 3 Encounters:   09/02/23 128/72  03/24/23 138/70  09/19/22 126/74    Physical Exam Vitals and nursing note reviewed.  Constitutional:      Appearance: He is well-developed.  HENT:     Head: Normocephalic and atraumatic.     Right Ear: Tympanic membrane, ear canal and external ear normal.     Left Ear: Tympanic membrane, ear canal and external ear normal.     Nose: Nose normal.     Mouth/Throat:     Mouth: Mucous membranes are moist.     Dentition: Normal dentition.  Eyes:     General: Lids are normal. No scleral icterus.    Conjunctiva/sclera: Conjunctivae normal.     Pupils: Pupils are equal, round,  and reactive to light.  Neck:     Thyroid: No thyromegaly.     Vascular: No carotid bruit, hepatojugular reflux or JVD.     Trachea: No tracheal deviation.  Cardiovascular:     Rate and Rhythm: Normal rate and regular rhythm.     Heart sounds: No murmur heard.    No friction rub. No gallop.  Pulmonary:     Effort: Pulmonary effort is normal.     Breath sounds: Normal breath sounds. No wheezing, rhonchi or rales.  Abdominal:     General: Bowel sounds are normal.     Palpations: Abdomen is soft. There is no hepatomegaly, splenomegaly or mass.     Tenderness: There is no abdominal tenderness.     Hernia: There is no hernia in the left inguinal area.  Musculoskeletal:        General: Normal range of motion.     Cervical back: Normal range of motion and neck supple.  Lymphadenopathy:     Cervical: No cervical adenopathy.  Skin:    General: Skin is warm and dry.     Findings: No rash.  Neurological:     Mental Status: He is alert and oriented to person, place, and time.     Sensory: No sensory deficit.     Deep Tendon Reflexes: Reflexes are normal and symmetric.  Psychiatric:        Mood and Affect: Mood is not anxious or depressed.     Wt Readings from Last 3 Encounters:  09/02/23 191 lb (86.6 kg)  03/24/23 188 lb (85.3 kg)  09/19/22 188 lb (85.3 kg)    BP 128/72   Pulse 80    Ht 5\' 10"  (1.778 m)   Wt 191 lb (86.6 kg)   SpO2 98%   BMI 27.41 kg/m   Assessment and Plan: 1. Benign essential HTN (Primary) Chronic.  Controlled.  Stable.  Blood pressure today 128/72.  Asymptomatic.  Tolerating medication well.  Continue amlodipine 5 mg once a day.  Will check CMP for electrolytes and GFR.  Will recheck patient in 6 months. - amLODipine (NORVASC) 5 MG tablet; Take 1 tablet (5 mg total) by mouth daily.  Dispense: 90 tablet; Refill: 1 - Comprehensive metabolic panel  2. Dyslipidemia .  Controlled.  Stable.  Patient given low-cholesterol low triglyceride dietary guidelines.  Will continue atorvastatin 20 mg once a day.  Will check lipid panel as well as CMP monitor hepatic concerns. - atorvastatin (LIPITOR) 20 MG tablet; TAKE 1 TABLET(20 MG) BY MOUTH DAILY  Dispense: 90 tablet; Refill: 1 - Lipid Panel With LDL/HDL Ratio  3. Seasonal allergic rhinitis due to pollen Chronic.  Controlled.  Stable.  In addition over-the-counter nonsedating antihistamine patient takes Singulair 10 mg once a day. - montelukast (SINGULAIR) 10 MG tablet; Take 1 tablet (10 mg total) by mouth at bedtime.  Dispense: 90 tablet; Refill: 1  4. Gastroesophageal reflux disease, unspecified whether esophagitis present Chronic.  Controlled.  Stable.  Patient is asymptomatic without dysphagia or heartburn.  This is controlled on pantoprazole 40 mg once a day.  Will continue at current dosing and will recheck in 6 months - pantoprazole (PROTONIX) 40 MG tablet; Take 1 tablet (40 mg total) by mouth daily.  Dispense: 90 tablet; Refill: 1     Elizabeth Sauer, MD

## 2023-09-03 LAB — COMPREHENSIVE METABOLIC PANEL
ALT: 37 IU/L (ref 0–44)
AST: 36 IU/L (ref 0–40)
Albumin: 4.3 g/dL (ref 3.9–4.9)
Alkaline Phosphatase: 102 IU/L (ref 44–121)
BUN/Creatinine Ratio: 11 (ref 10–24)
BUN: 11 mg/dL (ref 8–27)
Bilirubin Total: 0.5 mg/dL (ref 0.0–1.2)
CO2: 21 mmol/L (ref 20–29)
Calcium: 9.2 mg/dL (ref 8.6–10.2)
Chloride: 105 mmol/L (ref 96–106)
Creatinine, Ser: 0.99 mg/dL (ref 0.76–1.27)
Globulin, Total: 2.7 g/dL (ref 1.5–4.5)
Glucose: 87 mg/dL (ref 70–99)
Potassium: 4.3 mmol/L (ref 3.5–5.2)
Sodium: 142 mmol/L (ref 134–144)
Total Protein: 7 g/dL (ref 6.0–8.5)
eGFR: 83 mL/min/{1.73_m2} (ref >59)

## 2023-09-03 LAB — LIPID PANEL WITH LDL/HDL RATIO
Cholesterol, Total: 188 mg/dL (ref 100–199)
HDL: 40 mg/dL (ref >39)
LDL Chol Calc (NIH): 115 mg/dL — ABNORMAL HIGH (ref 0–99)
LDL/HDL Ratio: 2.9 ratio (ref 0.0–3.6)
Triglycerides: 186 mg/dL — ABNORMAL HIGH (ref 0–149)
VLDL Cholesterol Cal: 33 mg/dL (ref 5–40)

## 2023-10-02 ENCOUNTER — Telehealth: Payer: Self-pay | Admitting: Family Medicine

## 2023-10-02 NOTE — Telephone Encounter (Signed)
 Copied from CRM [id number]. Topic: Medicare AWV >> Oct 02, 2023  1:57 PM Payton Doughty wrote: Reason for CRM: Called LVM 10/02/2023 to schedule AWV. Please schedule Virtual or Telehealth visits ONLY.   Verlee Rossetti; Care Guide Ambulatory Clinical Support [place] [facility] Direct Dial: [phone]

## 2024-02-16 ENCOUNTER — Encounter: Payer: Self-pay | Admitting: Family Medicine

## 2024-03-04 ENCOUNTER — Ambulatory Visit: Admitting: Student

## 2024-03-11 ENCOUNTER — Encounter: Payer: Self-pay | Admitting: Student

## 2024-03-11 ENCOUNTER — Ambulatory Visit (INDEPENDENT_AMBULATORY_CARE_PROVIDER_SITE_OTHER): Admitting: Student

## 2024-03-11 VITALS — BP 124/78 | HR 68 | Ht 70.0 in | Wt 188.0 lb

## 2024-03-11 DIAGNOSIS — E785 Hyperlipidemia, unspecified: Secondary | ICD-10-CM | POA: Diagnosis not present

## 2024-03-11 DIAGNOSIS — I10 Essential (primary) hypertension: Secondary | ICD-10-CM

## 2024-03-11 DIAGNOSIS — J309 Allergic rhinitis, unspecified: Secondary | ICD-10-CM | POA: Insufficient documentation

## 2024-03-11 DIAGNOSIS — Z23 Encounter for immunization: Secondary | ICD-10-CM

## 2024-03-11 DIAGNOSIS — K219 Gastro-esophageal reflux disease without esophagitis: Secondary | ICD-10-CM | POA: Insufficient documentation

## 2024-03-11 MED ORDER — AMLODIPINE BESYLATE 5 MG PO TABS: 5.0000 mg | ORAL_TABLET | Freq: Every day | ORAL | 1 refills | Status: AC

## 2024-03-11 MED ORDER — ATORVASTATIN CALCIUM 40 MG PO TABS: 20.0000 mg | ORAL_TABLET | Freq: Every day | ORAL | 1 refills | Status: DC

## 2024-03-11 NOTE — Assessment & Plan Note (Signed)
 Using montelukast  10 mg daily, not having significant symptoms. Uses flonase  vocationally. Continue montelukast .

## 2024-03-11 NOTE — Assessment & Plan Note (Signed)
 Well controlled on amlodipine  5 mg daily. Tolerating well. Continue on current medications.

## 2024-03-11 NOTE — Assessment & Plan Note (Signed)
 Is on atorvastatin  20 mg daily. The 10-year ASCVD risk score (Arnett DK, et al., 2019) is: 18.6%.

## 2024-03-12 NOTE — Progress Notes (Signed)
 Established Patient Office Visit  Subjective   Patient ID: Kenneth Velasquez, male    DOB: 1955/07/27  Age: 68 y.o. MRN: [id number]  Chief Complaint  Patient presents with   Hypertension   Hyperlipidemia   Gastroesophageal Reflux    Kenneth Velasquez with medical hx listed below presents today for transfer of car. Feeling well today. PCP Dr. Joshua recently retired. No acute complaints. Please refer to problem based charting for further details and assessment and plan of current problem and chronic medical conditions.  Patient Active Problem List   Diagnosis Date Noted   Allergic rhinitis 03/11/2024   GERD (gastroesophageal reflux disease) 03/11/2024   Dyslipidemia 07/24/2017   Routine general medical examination at a health care facility 11/08/2014   Benign essential HTN 11/08/2014   H/O arthritis 11/08/2014      ROS Refer to HPI    Objective:     Outpatient Encounter Medications as of 03/11/2024  Medication Sig   aspirin 81 MG tablet Take 1 tablet by mouth daily.   cetirizine  (ZYRTEC ) 10 MG tablet Take 1 tablet (10 mg total) by mouth daily. (Patient taking differently: Take 10 mg by mouth as needed.)   clotrimazole -betamethasone  (LOTRISONE ) cream Apply 1 application. topically daily.   fluticasone  (FLONASE ) 50 MCG/ACT nasal spray Place 2 sprays into both nostrils daily.   montelukast  (SINGULAIR ) 10 MG tablet Take 1 tablet (10 mg total) by mouth at bedtime.   pantoprazole  (PROTONIX ) 40 MG tablet Take 1 tablet (40 mg total) by mouth daily.   [DISCONTINUED] amLODipine  (NORVASC ) 5 MG tablet Take 1 tablet (5 mg total) by mouth daily.   [DISCONTINUED] atorvastatin  (LIPITOR) 20 MG tablet TAKE 1 TABLET(20 MG) BY MOUTH DAILY   amLODipine  (NORVASC ) 5 MG tablet Take 1 tablet (5 mg total) by mouth daily.   atorvastatin  (LIPITOR) 40 MG tablet Take 0.5 tablets (20 mg total) by mouth daily. TAKE 1 TABLET(20 MG) BY MOUTH DAILY   No facility-administered encounter medications on file as  of 03/11/2024.    BP 124/78   Pulse 68   Ht 5' 10 (1.778 m)   Wt 188 lb (85.3 kg)   SpO2 94%   BMI 26.98 kg/m  BP Readings from Last 3 Encounters:  03/11/24 124/78  09/02/23 128/72  03/24/23 138/70    Physical Exam Constitutional:      Appearance: Normal appearance.  HENT:     Mouth/Throat:     Mouth: Mucous membranes are moist.     Pharynx: Oropharynx is clear.  Cardiovascular:     Rate and Rhythm: Normal rate and regular rhythm.  Pulmonary:     Effort: Pulmonary effort is normal.     Breath sounds: No rhonchi or rales.  Abdominal:     General: Abdomen is flat. Bowel sounds are normal. There is no distension.     Palpations: Abdomen is soft.     Tenderness: There is no abdominal tenderness.  Musculoskeletal:        General: Normal range of motion.     Right lower leg: No edema.     Left lower leg: No edema.  Skin:    General: Skin is warm and dry.     Capillary Refill: Capillary refill takes less than 2 seconds.  Neurological:     General: No focal deficit present.     Mental Status: He is alert and oriented to person, place, and time.  Psychiatric:        Mood and Affect: Mood normal.  Behavior: Behavior normal.        03/11/2024   10:17 AM 09/02/2023    9:48 AM 03/24/2023    9:27 AM  Depression screen PHQ 2/9  Decreased Interest 0 0 0  Down, Depressed, Hopeless 0 0 0  PHQ - 2 Score 0 0 0  Altered sleeping 0 0 0  Tired, decreased energy 0 0 0  Change in appetite 0 0 0  Feeling bad or failure about yourself  0 0 0  Trouble concentrating 0 0 0  Moving slowly or fidgety/restless 0 0 0  Suicidal thoughts 0 0 0  PHQ-9 Score 0 0 0  Difficult doing work/chores Not difficult at all Not difficult at all Not difficult at all       03/11/2024   10:17 AM 09/02/2023    9:48 AM 03/24/2023    9:27 AM 09/19/2022    9:06 AM  GAD 7 : Generalized Anxiety Score  Nervous, Anxious, on Edge 0 0 0 0  Control/stop worrying 0 0 0 0  Worry too much - different things 0  0 0 0  Trouble relaxing 0 0 0 0  Restless 0 0 0 0  Easily annoyed or irritable 0 0 0 0  Afraid - awful might happen 0 0 0 0  Total GAD 7 Score 0 0 0 0  Anxiety Difficulty Not difficult at all Not difficult at all Not difficult at all Not difficult at all    No results found for any visits on 03/11/24.  Last CBC Lab Results  Component Value Date   WBC 6.6 01/06/2015   HGB 14.9 01/06/2015   HCT 43.7 01/06/2015   MCV 90.8 01/06/2015   MCH 31.0 01/06/2015   RDW 12.8 01/06/2015   PLT 255 01/06/2015   Last metabolic panel Lab Results  Component Value Date   GLUCOSE 87 09/02/2023   NA 142 09/02/2023   K 4.3 09/02/2023   CL 105 09/02/2023   CO2 21 09/02/2023   BUN 11 09/02/2023   CREATININE 0.99 09/02/2023   EGFR 83 09/02/2023   CALCIUM  9.2 09/02/2023   PHOS 3.4 09/19/2022   PROT 7.0 09/02/2023   ALBUMIN 4.3 09/02/2023   LABGLOB 2.7 09/02/2023   AGRATIO 1.6 03/21/2022   BILITOT 0.5 09/02/2023   ALKPHOS 102 09/02/2023   AST 36 09/02/2023   ALT 37 09/02/2023   ANIONGAP 12 01/06/2015   Last lipids Lab Results  Component Value Date   CHOL 188 09/02/2023   HDL 40 09/02/2023   LDLCALC 115 (H) 09/02/2023   TRIG 186 (H) 09/02/2023   CHOLHDL 5.6 (H) 07/24/2017   The 10-year ASCVD risk score (Arnett DK, et al., 2019) is: 18.6%    Assessment & Plan:  Allergic rhinitis, unspecified seasonality, unspecified trigger Assessment & Plan: Using montelukast  10 mg daily, not having significant symptoms. Uses flonase  vocationally. Continue montelukast .     Benign essential HTN Assessment & Plan: Well controlled on amlodipine  5 mg daily. Tolerating well. Continue on current medications.   Orders: -     amLODIPine  Besylate; Take 1 tablet (5 mg total) by mouth daily.  Dispense: 90 tablet; Refill: 1  Dyslipidemia Assessment & Plan: Is on atorvastatin  20 mg daily. The 10-year ASCVD risk score (Arnett DK, et al., 2019) is: 18.6%.  Increase atorvastatin  to 40 mg daily.     Orders: -     Atorvastatin  Calcium ; Take 0.5 tablets (20 mg total) by mouth daily. TAKE 1 TABLET(20 MG) BY MOUTH DAILY  Dispense: 90 tablet; Refill: 1  Gastroesophageal reflux disease without esophagitis Assessment & Plan: Well controlled on pantoprazole  40 mg daily. Continue current medications.    Encounter for immunization -     Flu vaccine HIGH DOSE PF(Fluzone Trivalent)     Return in about 6 months (around 09/08/2024) for physical.    Harlene Saddler, MD

## 2024-03-12 NOTE — Assessment & Plan Note (Signed)
 Well controlled on pantoprazole  40 mg daily. Continue current medications.

## 2024-03-24 ENCOUNTER — Telehealth: Payer: Self-pay | Admitting: Student

## 2024-03-24 NOTE — Telephone Encounter (Signed)
 Copied from CRM #[id number]. Topic: Medicare AWV >> Mar 24, 2024  9:45 AM Nathanel DEL wrote: Reason for CRM: Called 03/24/2024 to sched AWV - NO VOICEMAIL  Nathanel Paschal; Care Guide Ambulatory Clinical Support [place] [facility] Direct Dial: [phone]

## 2024-04-05 DIAGNOSIS — H2513 Age-related nuclear cataract, bilateral: Secondary | ICD-10-CM | POA: Diagnosis not present

## 2024-04-05 DIAGNOSIS — H4323 Crystalline deposits in vitreous body, bilateral: Secondary | ICD-10-CM | POA: Diagnosis not present

## 2024-04-21 ENCOUNTER — Ambulatory Visit

## 2024-04-23 NOTE — Progress Notes (Signed)
 MATTY VANROEKEL                                          MRN: [id number]   04/23/2024   The [facility] Quality Team Specialist reviewed this patient medical record for the purposes of chart review for care gap closure. The following were reviewed: chart review for care gap closure-colorectal cancer screening.    [facility] Quality Team

## 2024-04-25 LAB — COLOGUARD

## 2024-04-26 ENCOUNTER — Other Ambulatory Visit: Payer: Self-pay | Admitting: Student

## 2024-04-26 ENCOUNTER — Telehealth: Payer: Self-pay

## 2024-04-26 DIAGNOSIS — E785 Hyperlipidemia, unspecified: Secondary | ICD-10-CM

## 2024-04-26 MED ORDER — ATORVASTATIN CALCIUM 40 MG PO TABS: 40.0000 mg | ORAL_TABLET | Freq: Every day | ORAL | 1 refills | Status: AC

## 2024-04-26 NOTE — Telephone Encounter (Signed)
 Copied from CRM [id number]. Topic: Clinical - Prescription Issue >> Apr 26, 2024  8:34 AM Ahlexyia S wrote: Reason for CRM: Pt called in stating that he attempted to go pick up his med atorvastatin  (LIPITOR) 40 MG tablet. Pt was told that he is unable to pick the medication up due to an issue. When asked what the issue is pt stated that the med was previously 20mg  and the dosage is now supposed to be 40mg . Pt requested a callback when this is done.

## 2024-05-07 ENCOUNTER — Other Ambulatory Visit: Payer: Self-pay

## 2024-05-07 DIAGNOSIS — J301 Allergic rhinitis due to pollen: Secondary | ICD-10-CM

## 2024-05-07 MED ORDER — MONTELUKAST SODIUM 10 MG PO TABS: 10.0000 mg | ORAL_TABLET | Freq: Every day | ORAL | 1 refills | Status: AC

## 2024-06-08 ENCOUNTER — Telehealth: Payer: Self-pay | Admitting: Student

## 2024-06-08 NOTE — Telephone Encounter (Signed)
 Copied from CRM [id number]. Topic: Medicare AWV >> Jun 08, 2024 10:07 AM Nathanel DEL wrote: Called LVM 06/08/2024 to sched AWV. Please schedule in office or virtual visit.   Nathanel Paschal; Care Guide Ambulatory Clinical Support [place] [facility] Direct Dial: [phone]

## 2024-09-08 ENCOUNTER — Encounter: Admitting: Student
